# Patient Record
Sex: Female | Born: 2000
Health system: Southern US, Community
[De-identification: ages and names within clinical notes are randomized; demographics above are authoritative.]

## PROBLEM LIST (undated history)

## (undated) DIAGNOSIS — L700 Acne vulgaris: Secondary | ICD-10-CM

## (undated) DIAGNOSIS — R61 Generalized hyperhidrosis: Secondary | ICD-10-CM

## (undated) DIAGNOSIS — F429 Obsessive-compulsive disorder, unspecified: Secondary | ICD-10-CM

## (undated) DIAGNOSIS — F909 Attention-deficit hyperactivity disorder, unspecified type: Secondary | ICD-10-CM

## (undated) DIAGNOSIS — F411 Generalized anxiety disorder: Secondary | ICD-10-CM

## (undated) DIAGNOSIS — R5383 Other fatigue: Secondary | ICD-10-CM

## (undated) HISTORY — DX: Other fatigue: R53.83

## (undated) HISTORY — DX: Attention-deficit hyperactivity disorder, unspecified type: F90.9

## (undated) HISTORY — DX: Acne vulgaris: L70.0

## (undated) HISTORY — DX: Obsessive-compulsive disorder, unspecified: F42.9

## (undated) HISTORY — DX: Generalized hyperhidrosis: R61

## (undated) HISTORY — DX: Generalized anxiety disorder: F41.1

---
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
## 2019-04-11 DIAGNOSIS — Z20828 Contact with and (suspected) exposure to other viral communicable diseases: Secondary | ICD-10-CM | POA: Diagnosis not present

## 2019-09-23 ENCOUNTER — Ambulatory Visit: Payer: BC Managed Care – PPO | Admitting: Psychiatry

## 2019-09-24 ENCOUNTER — Encounter: Payer: Self-pay | Admitting: Psychiatry

## 2019-09-24 ENCOUNTER — Other Ambulatory Visit: Payer: Self-pay

## 2019-09-24 ENCOUNTER — Ambulatory Visit (INDEPENDENT_AMBULATORY_CARE_PROVIDER_SITE_OTHER): Payer: BC Managed Care – PPO | Admitting: Psychiatry

## 2019-09-24 VITALS — Ht 65.5 in | Wt 131.0 lb

## 2019-09-24 DIAGNOSIS — F902 Attention-deficit hyperactivity disorder, combined type: Secondary | ICD-10-CM

## 2019-09-24 DIAGNOSIS — F428 Other obsessive-compulsive disorder: Secondary | ICD-10-CM

## 2019-09-24 DIAGNOSIS — F411 Generalized anxiety disorder: Secondary | ICD-10-CM | POA: Diagnosis not present

## 2019-09-24 MED ORDER — SERTRALINE HCL 50 MG PO TABS: 50.0000 mg | ORAL_TABLET | Freq: Every day | ORAL | 1 refills | Status: DC
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
## 2019-10-01 ENCOUNTER — Other Ambulatory Visit: Payer: Self-pay

## 2019-10-01 ENCOUNTER — Ambulatory Visit (INDEPENDENT_AMBULATORY_CARE_PROVIDER_SITE_OTHER): Payer: BC Managed Care – PPO | Admitting: Addiction (Substance Use Disorder)

## 2019-10-01 DIAGNOSIS — F1220 Cannabis dependence, uncomplicated: Secondary | ICD-10-CM | POA: Diagnosis not present

## 2019-10-01 DIAGNOSIS — F4322 Adjustment disorder with anxiety: Secondary | ICD-10-CM

## 2019-10-01 DIAGNOSIS — F411 Generalized anxiety disorder: Secondary | ICD-10-CM

## 2019-10-01 NOTE — Progress Notes (Signed)
[facility] Counselor Initial Adult Exam  Name: Kathryn Blanchard Date: 10/01/2019 MRN: [id number] DOB: [birth year] PCP: Patient, No Pcp Per  Time spent: 2:10- 3:00 50 mins  Reason for Visit /Presenting Problem: Client came in reporting anxious distress related to family issues and parent split. Client also reporting issues that are somatic: ie: trouble eating, racing heart, panic attacks, blacking out. Client reported issues eating and now needing THC to eat, sleep, calm down from generalized anxiety thoughts and yet smoking THC (a joint- 1 gram a day) also triggering the panic attacks. Client reported wanting help on reducing her anxiety, increasing her motivation, and over-planning everything and obsessive thoughts. Client explained the things plaguing her: No desire to eat and losing 30 lbs and now being underweight. Client reporting emotional instability and struggles with emotional instability and disorganization. Client with ongoing fear of missing scheduled appts and also of not feeling clean (hygeine). Client experiencing racing thoughts, panic attacks (racing heart and darkening eyesight during attacks and hearing goes away and sweating), and trouble focusing and understanding what she is reading/ doing in math problems/ect. Client expressed goals with therapist and confirmed her tp. Client built therapeutic rapport with therapist and wants to continue coming to therapy via teletherapy if her insurance allows when she returns to [place] to go back to school.  Mental Status Exam:   Appearance:   Neat     Behavior:  Appropriate  Motor:  Normal  Speech/Language:   Normal Rate  Affect:  Full Range  Mood:  euphoric  Thought process:  normal  Thought content:    Rumination  Sensory/Perceptual disturbances:    WNL  Orientation:  x4  Attention:  Fair  Concentration:  Good  Memory:  WNL  Fund of knowledge:   Good  Insight:    Good  Judgment:   Fair  Impulse Control:  Fair   Reported Symptoms:   No desire to eat, emotional instability, no motivation to get out of bed and to engage socially at times, racing thoughts, panic attacks- racing heart and darkening eyesight during attacks and hearing goes away and sweating.    Risk Assessment: Danger to Self:  No Self-injurious Behavior: No Danger to Others: No Duty to Warn:no Physical Aggression / Violence:No  Access to Firearms a concern: No  Gang Involvement:No  Patient / guardian was educated about steps to take if suicide or homicide risk level increases between visits: n/a While future psychiatric events cannot be accurately predicted, the patient does not currently require acute inpatient psychiatric care and does not currently meet [facility] involuntary commitment criteria.  Substance Abuse History: Current substance abuse: Yes     Past Psychiatric History:   No previous psychological problems have been observed Outpatient Providers: none. History of Psych Hospitalization: No  Psychological Testing: n/a   Abuse History: Victim of No., n/a   Report needed: No. Victim of Neglect:No. Perpetrator of n/a  Witness / Exposure to Domestic Violence: No   Protective Services Involvement: No  Witness to [employer] Violence:  No   Family History:  Family History  Problem Relation Age of Onset  . Anxiety disorder Mother   . ADD / ADHD Mother   . Depression Mother   . OCD Father   . Alcohol abuse Neg Hx   . Bipolar disorder Neg Hx   . Dementia Neg Hx   . Drug abuse Neg Hx   . Paranoid behavior Neg Hx   . Physical abuse Neg Hx   . Schizophrenia  Neg Hx   . Seizures Neg Hx   . Sexual abuse Neg Hx     Living situation: the patient lives with their family. Parents with MH disorders.   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 History of legal issue / charges: n/a  Medical History/Surgical History: Past Medical History:  Diagnosis Date  . Acne vulgaris   . ADHD (attention deficit hyperactivity disorder)   . Diaphoresis   . Fatigue   . Obsessive-compulsive disorder     No past surgical history on file.  Medications: Current Outpatient Medications  Medication Sig Dispense Refill  . sertraline (ZOLOFT) 50 MG tablet Take 1 tablet (50 mg total) by mouth daily after breakfast. 301 tablet 1   No current facility-administered medications for this visit.   Diagnoses:    ICD-10-CM   1. Generalized anxiety disorder  F41.1   2. Adjustment disorder with anxious mood  F43.22   3. Moderate tetrahydrocannabinol (THC) dependence ([place])  F12.20     Plan of Care:  Client to return for weekly therapy with Sammuel Cooper, therapist, to review again in 6 months.  Client to engage in positive self talk and challenging negative internal ruminations and self talk causing client to be overly anxious and worried using CBT, on daily practice. Client to engage in mindfulness: ie body scans each eveneing to help process and discharge emotional distress caused by obsessive thoughts and compulsive behaviors and somatic anxieties.  Client to utilize BSP (brainspotting) with therapist to help client regulate their anxiety in a somatic- felt body sense way: (ie by working  to reduce muscle tension, ruminations, increased heart rate, constant worrying and feeling "dysregulated" ) by decreasing anxiety by 33% in the next 6 months.  Client to work on emotion regulation AEB having less angry outbursts at family when irritated/ stressed/anxious.  Client to assess reducing THC use by 33% AEB implementing harm reduction techniques to lessen her panic attacks triggered when high by 50%.   Client to prioritize sleep 8+ hours each week night AEB going to bed by 10pm each night.   Barnie Del, LCSW, LCAS, CCTP, CCS-I, BSP

## 2019-10-03 ENCOUNTER — Encounter: Payer: Self-pay | Admitting: Addiction (Substance Use Disorder)

## 2019-10-14 ENCOUNTER — Ambulatory Visit (INDEPENDENT_AMBULATORY_CARE_PROVIDER_SITE_OTHER): Payer: BC Managed Care – PPO | Admitting: Addiction (Substance Use Disorder)

## 2019-10-14 ENCOUNTER — Other Ambulatory Visit: Payer: Self-pay

## 2019-10-14 DIAGNOSIS — F4322 Adjustment disorder with anxiety: Secondary | ICD-10-CM | POA: Diagnosis not present

## 2019-10-14 DIAGNOSIS — F411 Generalized anxiety disorder: Secondary | ICD-10-CM

## 2019-10-14 DIAGNOSIS — F1220 Cannabis dependence, uncomplicated: Secondary | ICD-10-CM | POA: Diagnosis not present

## 2019-10-14 NOTE — Progress Notes (Signed)
      [facility] Counselor/Therapist Progress Note  Patient ID: Kathryn Blanchard, MRN: [id number],    Date: 10/14/2019  Time Spent: 1:15-2:00 45 mins  Treatment Type: Individual Therapy  Reported Symptoms: sweaty palms, racing heartrate, nerves, stomach uneasiness  Mental Status Exam:  Appearance:   Neat     Behavior:  Appropriate  Motor:  Tremor  Speech/Language:   Clear and Coherent  Affect:  Appropriate and Congruent  Mood:  anxious and constricted  Thought process:  normal  Thought content:    Obsessions and Rumination  Sensory/Perceptual disturbances:    WNL  Orientation:  x4  Attention:  Good  Concentration:  Good  Memory:  WNL  Fund of knowledge:   Good  Insight:    Good  Judgment:   Good  Impulse Control:  Good   Risk Assessment: Danger to Self:  No Self-injurious Behavior: No Danger to Others: No Duty to Warn:no Physical Aggression / Violence:No  Access to Firearms a concern: No  Gang Involvement:No   Subjective: Client came in late to session following a issue regarding online classes. Client reported having sweaty palms, racing heartrate, nerves, stomach uneasiness. Therapist provided psychoeducation around trauma, stress, the nervous system, and low body temperature related to stress/tense and higher related to more calm/relaxed. Client had body temperature of 71 degrees and therapist engaged in MI and Mindfulness with the client to lead client in learning to regulate her anxiety and understand more about her thoughts and triggers. Client engaged in [place] with the therapist to process overstimulation and irritability with her family/being touched/asked questions. Client practiced BSP and was able to make progress and identify more of the sensations while gaining compassion for herself and practicing curiosity. Therapist demonstrated CBT with client and taught her to identify the core thoughts triggering her anxiety. Client's body temperature raised up to 82  degrees following the session and a reduction of anxious thoughts and body sensations.    Interventions: Cognitive Behavioral Therapy, Mindfulness Meditation, Motivational Interviewing and Brainspotting  Diagnosis:   ICD-10-CM   1. Generalized anxiety disorder  F41.1   2. Adjustment disorder with anxious mood  F43.22   3. Tetrahydrocannabinol (THC) use disorder, moderate, dependence ([facility])  F12.20     Plan of Care: Client to return for weekly therapy with Zoila Shutter, therapist, to review again in 6 months. Client to engage in positive self talk and challenging negative internal ruminationsand self talk causing client to be overly anxious and worriedusing CBT, on daily practice. Client to engage in mindfulness: ie body scans each eveneing to help process and discharge emotional distress caused by obsessive thoughts and compulsive behaviors and somatic anxieties.  Client to utilize BSP (brainspotting) with therapist to help client regulate their anxiety in a somatic- felt body sense way: (ieby working to reducemuscle tension,ruminations,increased heart rate, constant worrying and feeling"dysregulated" ) by decreasing anxietyby 33% in the next 6 months. Client to work on emotion regulation AEB having less angry outbursts at family when irritated/ stressed/anxious.  Client to assess reducing THC use by 33% AEB implementing harm reduction techniques to lessen her panic attacks triggered when high by 50%.  Client to prioritize sleep 8+ hours each week night AEB going to bed by 10pm each night.  Pauline Good, LCSW, LCAS, CCTP, CCS-I, BSP

## 2019-10-15 DIAGNOSIS — Z20822 Contact with and (suspected) exposure to covid-19: Secondary | ICD-10-CM | POA: Diagnosis not present

## 2019-10-22 ENCOUNTER — Encounter: Payer: Self-pay | Admitting: Addiction (Substance Use Disorder)

## 2019-10-22 ENCOUNTER — Ambulatory Visit (INDEPENDENT_AMBULATORY_CARE_PROVIDER_SITE_OTHER): Payer: BC Managed Care – PPO | Admitting: Addiction (Substance Use Disorder)

## 2019-10-22 ENCOUNTER — Ambulatory Visit: Payer: BC Managed Care – PPO | Admitting: Psychiatry

## 2019-10-22 DIAGNOSIS — F4322 Adjustment disorder with anxiety: Secondary | ICD-10-CM | POA: Diagnosis not present

## 2019-10-22 DIAGNOSIS — F1220 Cannabis dependence, uncomplicated: Secondary | ICD-10-CM | POA: Diagnosis not present

## 2019-10-22 DIAGNOSIS — F411 Generalized anxiety disorder: Secondary | ICD-10-CM | POA: Diagnosis not present

## 2019-10-22 NOTE — Progress Notes (Signed)
[facility] Counselor/Therapist Progress Note  Patient ID: Kathryn Blanchard, MRN: [id number],    Date: 10/22/2019  Time Spent: 2:05-2:59 54 mins  Treatment Type: Individual Therapy  Reported Symptoms: excessive sweating, panic attacks, feeling unable to keep up at school due to no privacy to do online classes/ MH counseling sessions.   Mental Status Exam:  Appearance:   Neat     Behavior:  Appropriate  Motor:  Normal  Speech/Language:   Clear and Coherent  Affect:  Appropriate and Congruent  Mood:  anxious and dysthymic  Thought process:  normal  Thought content:    Obsessions and Rumination  Sensory/Perceptual disturbances:    WNL  Orientation:  x4  Attention:  Good  Concentration:  Good  Memory:  WNL  Fund of knowledge:   Good  Insight:    Good  Judgment:   Good  Impulse Control:  Good   Risk Assessment: Danger to Self:  No Self-injurious Behavior: No Danger to Others: No Duty to Warn:no Physical Aggression / Violence:No  Access to Firearms a concern: No  Gang Involvement:No   Virtual Visit via Telephone Note I Connected with client by a video enabled telemedicine/telehealth application or telephone, with their informed consent, and verified client privacy and that I am speaking with the correct person using two identifiers. I discussed the limitations, risks, security and privacy concerns of performing psychotherapy and management service by telephone/teletherapy and the availability of in person appointments and confirmed their location. I also discussed with the patient that there may be a patient responsible charge related to this service and to confirm with the front desk if their insurance accepts teletherapy. The patient expressed understanding and agreed to proceed. I discussed the treatment planning with the client. The client was provided an opportunity to ask questions and all were answered. The client agreed with the plan and demonstrated an understanding of  the instructions. The client was advised to call our office if symptoms worsen or feel they are in a crisis state and need immediate contact. Client also reminded of a crisis line number and to use 9-1-1 if there's an emergency.  Therapist Location: office; Client Location: her car outside her dorm up at [employer].  Subjective: Client discussed her top stressors in life right now since going back up to [employer]/school. Client processed some of the issues with sharing a room in a dorm since returning to school and not having privacy. Client talked about the issues with not even being able to find a quiet room/place in the dorm or room to do her MH counseling sessions or virtual school classes. Client reported that she is even in her car right now with it being 25 degrees outside also and discussed her decision making process with trying to ask to move off campus so she can have her own room to do the things she needs to to take care of her anxiety. Therapist used MI, CBT, and SFt to discuss with client her concerns, thoughts, desires and to have a safe place to talk openly. Therapist also inquired about client's goals for her living situation and MH and client expressed a need to: do what makes her healthy and her intentions to keep assessing for the next couple weeks. Client reported having more panic attacks and excessive sweating and isolating more since returning to school, but denied SI/HI/AVH. Client shared about the reason she wants to stay in the dorm because she is so close to her roommate  who really supports her MH. Client discussed other things stressing her MH including the toxic relationship with a boy who she feels comforted by but doesn't really make her happy. Client said she was unsure of what to do, but as they used CBT & MI reflections client realized she knew what to do, but needed to grieve the comforts (having his apt to go to to study/hang) before being able to let him go.    Interventions:  Cognitive Behavioral Therapy, Mindfulness Meditation, Motivational Interviewing and Solution-Oriented/Positive Psychology  Diagnosis:   ICD-10-CM   1. Generalized anxiety disorder  F41.1   2. Adjustment disorder with anxious mood  F43.22   3. Tetrahydrocannabinol (THC) use disorder, moderate, dependence ([place])  F12.20     Plan of Care: Client to return for weekly therapy with Sammuel Cooper, therapist, to review again in 6 months. Client to engage in positive self talk and challenging negative internal ruminationsand self talk causing client to be overly anxious and worriedusing CBT, on daily practice. Client to engage in mindfulness: ie body scans each eveneing to help process and discharge emotional distress caused by obsessive thoughts and compulsive behaviors and somatic anxieties.  Client to utilize BSP (brainspotting) with therapist to help client regulate their anxiety in a somatic- felt body sense way: (ieby working to reducemuscle tension,ruminations,increased heart rate, constant worrying and feeling"dysregulated" ) by decreasing anxietyby 33% in the next 6 months. Client to work on emotion regulation AEB having less angry outbursts at family when irritated/ stressed/anxious.  Client to assess reducing THC use by 33% AEB implementing harm reduction techniques to lessen her panic attacks triggered when high by 50%.  Client to prioritize sleep 8+ hours each week night AEB going to bed by 10pm each night.  Barnie Del, LCSW, LCAS, CCTP, CCS-I, BSP

## 2019-10-28 ENCOUNTER — Ambulatory Visit (INDEPENDENT_AMBULATORY_CARE_PROVIDER_SITE_OTHER): Payer: BC Managed Care – PPO | Admitting: Psychiatry

## 2019-10-28 ENCOUNTER — Encounter: Payer: Self-pay | Admitting: Psychiatry

## 2019-10-28 DIAGNOSIS — F428 Other obsessive-compulsive disorder: Secondary | ICD-10-CM

## 2019-10-28 DIAGNOSIS — F902 Attention-deficit hyperactivity disorder, combined type: Secondary | ICD-10-CM | POA: Diagnosis not present

## 2019-10-28 DIAGNOSIS — F411 Generalized anxiety disorder: Secondary | ICD-10-CM | POA: Diagnosis not present

## 2019-10-28 MED ORDER — VENLAFAXINE HCL ER 37.5 MG PO CP24: 37.5000 mg | ORAL_CAPSULE | Freq: Every day | ORAL | 1 refills | Status: DC

## 2019-10-28 NOTE — Progress Notes (Signed)
[facility] Med Check  Patient ID: Kathryn Blanchard,  MRN: [id number]  PCP: Patient, No Pcp Per  Date of Evaluation: 10/28/2019 Time spent:20 minutes from 1000 to 4  Chief Complaint:  Chief Complaint    Anxiety; ADHD      HISTORY/CURRENT STATUS: Kathryn Blanchard is provided telemedicine audiovisual appointment session 20 minutes video to video with consent with epic collateral for psychiatric interview and exam in 4-week evaluation and management of generalized anxiety and obsessive-compulsive disorder/ADHD.  She did start Zoloft successfully at 50 mg every morning finding side effect of excessive sweating even in the cold weather at [place].  She has found modest but definite efficacy though anxiety is still prominent but slightly improved.  Her compulsive shopping, cleaning rituals involving showers and skin grooming, and obsessive slowness particularly procrastinating getting out of bed have not improved.  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  She is back on campus and did have 3 telemedicine appointments with Kathryn Cooper, LCSW January 12 and 25 and October 22, 2019 and is scheduled for another.  Refused any further treatment after 1 therapy session a year ago elsewhere.  She reports no other observations from family or friends.  She reports no change in her pattern of use of cannabis, vaping, alcohol, having no significant consequences currently.  She has no mania, suicidality, psychosis or delirium.  Anxiety Presents for initial visit. Onset was more than 5 years ago. The problem has been gradually worsening. Symptoms include compulsions, decreased concentration, dizziness, excessive worry, muscle tension, nervous/anxious behavior, obsessions and panic. Patient reports no confusion, depressed mood or suicidal ideas. Symptoms occur most days. The severity of symptoms is interfering with daily activities, causing significant distress and moderate. The symptoms are aggravated  by social activities, work stress and medication. The quality of sleep is fair. Nighttime awakenings: one to two.   Risk factors include recent illness, a major life event, illicit drug use and alcohol intake. Her past medical history is significant for anxiety/panic attacks. There is no history of bipolar disorder, depression, hyperthyroidism or suicide attempts. Past treatments include counseling (CBT) and SSRIs. The treatment provided mild relief. Compliance with prior treatments has been variable. Prior compliance problems include difficulty with treatment plan and medication issues.    Individual Medical History/ Review of Systems: Changes? :No Needed Covid test in the interim.  Allergies: Patient has no known allergies.  Current Medications:  Current Outpatient Medications:  .  venlafaxine XR (EFFEXOR-XR) 37.5 MG 24 hr capsule, Take 1 capsule (37.5 mg total) by mouth daily with breakfast., Disp: 30 capsule, Rfl: 1   Medication Side Effects: Hyperhidrosis.  Family Medical/ Social History: Changes? No  MENTAL HEALTH EXAM:  There were no vitals taken for this visit.There is no height or weight on file to calculate BMI.  Not present here today.  General Appearance: N/A  Eye Contact:  N/A  Speech:  Clear and Coherent, Normal Rate and Talkative  Volume:  Normal  Mood:  Anxious, Euthymic, Irritable and Worthless  Affect:  Congruent, Inappropriate, Full Range and Anxious  Thought Process:  Coherent, Goal Directed, Irrelevant and Linear and Circumstantial associations  Orientation:  Full (Time, Place, and Person)  Thought Content: Logical, Ilusions, Obsessions and Rumination   Suicidal Thoughts:  No  Homicidal Thoughts:  No  Memory:  Immediate;   Good Remote;   Good  Judgement:  Fair  Insight:  Fair  Psychomotor Activity:  N/A  Concentration:  Concentration: Fair and  Attention Span: Poor  Recall:  Fair  Fund of Knowledge: Good  Language: Good  Assets:  Desire for  Improvement Resilience Social Support Talents/Skills  ADL's:  Intact  Cognition: WNL  Prognosis:  Good    DIAGNOSES:    ICD-10-CM   1. Generalized anxiety disorder  F41.1 venlafaxine XR (EFFEXOR-XR) 37.5 MG 24 hr capsule  2. Attention deficit hyperactivity disorder (ADHD), combined type, moderate  F90.2 venlafaxine XR (EFFEXOR-XR) 37.5 MG 24 hr capsule  3. Other obsessive-compulsive disorder  F42.8 venlafaxine XR (EFFEXOR-XR) 37.5 MG 24 hr capsule    Receiving Psychotherapy: Yes with Kathryn Shutter, LCSW   RECOMMENDATIONS: Psychosupportive psychoeducation updates prevention and monitoring and safety hygiene for options such as increasing Zoloft versus changing to an alternative or combined medications.  We had discussed the possibility of adding a stimulant to Zoloft but at this time with her side effects, symptom treatment matching concludes to discontinue Zoloft and change to Effexor and she prefers to start at the lowest dose.  She is E scribed Effexor 37.5 mg XR every morning sent as #30 with 1 refill to [facility] on [address].  We discussed the possibly of doubling the dose in 1 week if well-tolerated but not having sufficient projection of efficacy.  We review all issues for integrating with cognitive behavioral and other therapy formats without any change evident yet in vaping and social substance use.  She will follow up in 2 to 3 weeks for next steps and medication management or sooner if needed.   Virtual Visit via Video Note  I connected with Kathryn Blanchard on 10/28/19 at 10:00 AM EST by a video enabled telemedicine application and verified that I am speaking with the correct person using two identifiers.  Location: Patient: individually audio only declining video camera for panic and generalized anxiety with privacy at college residence. Provider: [facility] office   I discussed the limitations of evaluation and management by telemedicine and the availability of  in person appointments. The patient expressed understanding and agreed to proceed.  History of Present Illness: 4-week evaluation and management address generalized anxiety and obsessive-compulsive disorder/ADHD.  She did start Zoloft successfully at 50 mg every morning finding side effect of excessive sweating even in the cold weather at [place].  She has found modest but definite efficacy though anxiety is still prominent but slightly improved.  Her compulsive shopping, cleaning rituals involving showers and skin grooming, and obsessive slowness particularly procrastinating getting out of bed have not improved.    Observations/Objective: Mood:  Anxious, Euthymic, Irritable and Worthless  Affect:  Congruent, Inappropriate, Full Range and Anxious  Thought Process:  Coherent, Goal Directed, Irrelevant and Linear and Circumstantial associations  Orientation:  Full (Time, Place, and Person)  Thought Content: Logical, Ilusions, Obsessions and Rumination    Assessment and Plan: Psychosupportive psychoeducation updates prevention and monitoring and safety hygiene for options such as increasing Zoloft versus changing to an alternative or combined medications.  We had discussed the possibility of adding a stimulant to Zoloft but at this time with her side effects, symptom treatment matching concludes to discontinue Zoloft and change to Effexor and she prefers to start at the lowest dose.  She is E scribed Effexor 37.5 mg XR every morning sent as #30 with 1 refill to [facility] on [address].  We discussed the possibly of doubling the dose in 1 week if well-tolerated but not having sufficient projection of efficacy.  We review all issues for integrating with  cognitive behavioral and other therapy formats without any change evident yet in vaping and social substance use.  Follow Up Instructions:  She will follow up in 2 to 3 weeks for next steps and medication management or sooner if needed.    I discussed  the assessment and treatment plan with the patient. The patient was provided an opportunity to ask questions and all were answered. The patient agreed with the plan and demonstrated an understanding of the instructions.   The patient was advised to call back or seek an in-person evaluation if the symptoms worsen or if the condition fails to improve as anticipated.  I provided 20 minutes of non-face-to-face time during this encounter. [place] Web[employer] meeting #[id number] Meeting password: [email or web address]  Chauncey Mann, MD   Chauncey Mann, MD

## 2019-11-01 ENCOUNTER — Encounter: Payer: Self-pay | Admitting: Addiction (Substance Use Disorder)

## 2019-11-01 ENCOUNTER — Ambulatory Visit: Payer: BC Managed Care – PPO | Admitting: Addiction (Substance Use Disorder)

## 2019-11-01 ENCOUNTER — Ambulatory Visit (INDEPENDENT_AMBULATORY_CARE_PROVIDER_SITE_OTHER): Payer: BC Managed Care – PPO | Admitting: Addiction (Substance Use Disorder)

## 2019-11-01 DIAGNOSIS — F4322 Adjustment disorder with anxiety: Secondary | ICD-10-CM

## 2019-11-01 DIAGNOSIS — F4010 Social phobia, unspecified: Secondary | ICD-10-CM

## 2019-11-01 DIAGNOSIS — F411 Generalized anxiety disorder: Secondary | ICD-10-CM

## 2019-11-01 NOTE — Progress Notes (Signed)
[facility] Counselor/Therapist Progress Note  Patient ID: LINDSAY STRAKA, MRN: [id number],    Date: 11/01/2019  Time Spent: 12:02-1:05 63 mins  Treatment Type: Individual Therapy  Reported Symptoms: less depression/ more motivation/ excessive anxiety and sweating and racing heart/ panic attacks.  Mental Status Exam:  Appearance:   Well Groomed     Behavior:  Appropriate  Motor:  Normal  Speech/Language:   Clear and Coherent  Affect:  Appropriate and Congruent  Mood:  anxious  Thought process:  normal  Thought content:    Obsessions and Rumination  Sensory/Perceptual disturbances:    WNL  Orientation:  x4  Attention:  Good  Concentration:  Good  Memory:  WNL  Fund of knowledge:   Good  Insight:    Good  Judgment:   Good  Impulse Control:  Good   Risk Assessment: Danger to Self:  No Self-injurious Behavior: No Danger to Others: No Duty to Warn:no Physical Aggression / Violence:No  Access to Firearms a concern: No  Gang Involvement:No   Virtual Visit via Telephone Note I Connected with client by a video enabled telemedicine/telehealth application or telephone, with their informed consent, and verified client privacy and that I am speaking with the correct person using two identifiers. I discussed the limitations, risks, security and privacy concerns of performing psychotherapy and management service by telephone/teletherapy and the availability of in person appointments and confirmed their location. I also discussed with the patient that there may be a patient responsible charge related to this service and to confirm with the front desk if their insurance accepts teletherapy. The patient expressed understanding and agreed to proceed. I discussed the treatment planning with the client. The client was provided an opportunity to ask questions and all were answered. The client agreed with the plan and demonstrated an understanding of the instructions. The client was advised to  call our office if symptoms worsen or feel they are in a crisis state and need immediate contact. Client also reminded of a crisis line number and to use 9-1-1 if there's an emergency.  Therapist Location: home; Client Location: her dorm.  Subjective: Client discussed having made a medication change to help improve her anxiety symptoms she was still having. Client reported a rush of heat, panic (racing heart/sweating), and then anger when confined with others in small places or physically stuck in places. Client reported doing everything she can to avoid those places with friends or in public,causing her to avoid relationships and things shed like to do/to enjoy. Client reports attempting to control her outburst, but worsens the anxiety/panic/physically disturbing sensations: feeling suffocated and losing sight/hearing. Therapist used psychoeducation to help normalize client's experiences and BSP to process first times she began to feel these suffocating/dimmed sensory sensations. Client began to tie triggers together to other social situations that brought on panic attacks: ie concerts, mosh pits, crowded classes, and closed up small cars where she didn't have room. Client made progress identifying these sensations and identifying other moments of loss of control connected to social and situational anxiety. Client processed somatic things and thoughts connected to traumatic experiences. Client also identified thoughts involved such as: "why did I just talk about that/share that!?!" and client found her most resourced eye position to process her anxiety sensations in until her SUDs reduced to a 1/10 during session. Therapist used DBT- distress tolerance coping skills with client. Client made progress learning how implement these DBT skills in future to help build her resilence.  Interventions: Cognitive Behavioral Therapy, Mindfulness Meditation, Motivational Interviewing and Solution-Oriented/Positive  Psychology  Diagnosis:   ICD-10-CM   1. Generalized anxiety disorder  F41.1   2. Social anxiety disorder  F40.10   3. Adjustment disorder with anxious mood  F43.22     Plan of Care: Client to return for weekly therapy with Sammuel Cooper, therapist, to review again in 6 months. Client to engage in positive self talk and challenging negative internal ruminationsand self talk causing client to be overly anxious and worriedusing CBT, on daily practice. Client to engage in mindfulness: ie body scans each eveneing to help process and discharge emotional distress caused by obsessive thoughts and compulsive behaviors and somatic anxieties.  Client to utilize BSP (brainspotting) with therapist to help client regulate their anxiety in a somatic- felt body sense way: (ieby working to reducemuscle tension,ruminations,increased heart rate, constant worrying and feeling"dysregulated" ) by decreasing anxietyby 33% in the next 6 months. Client to work on emotion regulation AEB having less angry outbursts at family when irritated/ stressed/anxious.  Client to assess reducing THC use by 33% AEB implementing harm reduction techniques to lessen her panic attacks triggered when high by 50%.  Client to prioritize sleep 8+ hours each week night AEB going to bed by 10pm each night.  Barnie Del, LCSW, LCAS, CCTP, CCS-I, BSP

## 2019-11-11 ENCOUNTER — Ambulatory Visit: Payer: BC Managed Care – PPO | Admitting: Addiction (Substance Use Disorder)

## 2019-11-23 ENCOUNTER — Other Ambulatory Visit: Payer: Self-pay | Admitting: Psychiatry

## 2019-11-23 DIAGNOSIS — F428 Other obsessive-compulsive disorder: Secondary | ICD-10-CM

## 2019-11-23 DIAGNOSIS — F411 Generalized anxiety disorder: Secondary | ICD-10-CM

## 2019-11-23 DIAGNOSIS — F902 Attention-deficit hyperactivity disorder, combined type: Secondary | ICD-10-CM

## 2019-11-25 NOTE — Telephone Encounter (Signed)
Apt 02/08, due to follow up

## 2019-12-17 ENCOUNTER — Telehealth: Payer: Self-pay | Admitting: Psychiatry

## 2019-12-17 DIAGNOSIS — F902 Attention-deficit hyperactivity disorder, combined type: Secondary | ICD-10-CM

## 2019-12-17 DIAGNOSIS — F411 Generalized anxiety disorder: Secondary | ICD-10-CM

## 2019-12-17 DIAGNOSIS — Z20828 Contact with and (suspected) exposure to other viral communicable diseases: Secondary | ICD-10-CM | POA: Diagnosis not present

## 2019-12-17 DIAGNOSIS — F428 Other obsessive-compulsive disorder: Secondary | ICD-10-CM

## 2019-12-17 MED ORDER — VENLAFAXINE HCL ER 75 MG PO CP24: 75.0000 mg | ORAL_CAPSULE | Freq: Every day | ORAL | 0 refills | Status: DC

## 2019-12-17 NOTE — Telephone Encounter (Signed)
Mother calls the office about increasing Effexor XR for continued high anxiety instructing that an answer be called back to [phone].  As there is no answer, I could only leave message whether Tanazia or mother that she is now a month overdue for follow-up possibly having exhausted the first fill and refill except that pharmacy appears to have requested a 90-day supply March 8 which if patient picked that up she can double to two of the 375 mg XR every morning otherwise sending a month supply of the 75 g XR and no refill to [facility] advising follow-up appointment as planned.

## 2019-12-17 NOTE — Telephone Encounter (Signed)
Patient's mom called and said that the effexor xr does not seem to be working . She is still having high anxiety. She would like to increase the effexor or change to something different. Please give her a call at [phone] and let her know what to do.

## 2019-12-20 DIAGNOSIS — Z20828 Contact with and (suspected) exposure to other viral communicable diseases: Secondary | ICD-10-CM | POA: Diagnosis not present

## 2019-12-20 DIAGNOSIS — Z03818 Encounter for observation for suspected exposure to other biological agents ruled out: Secondary | ICD-10-CM | POA: Diagnosis not present

## 2020-01-08 ENCOUNTER — Other Ambulatory Visit: Payer: Self-pay | Admitting: Psychiatry

## 2020-01-08 DIAGNOSIS — F902 Attention-deficit hyperactivity disorder, combined type: Secondary | ICD-10-CM

## 2020-01-08 DIAGNOSIS — F411 Generalized anxiety disorder: Secondary | ICD-10-CM

## 2020-01-08 DIAGNOSIS — F428 Other obsessive-compulsive disorder: Secondary | ICD-10-CM

## 2020-01-08 NOTE — Telephone Encounter (Signed)
Phone messaging indicated a follow up should be scheduled?

## 2020-01-08 NOTE — Telephone Encounter (Signed)
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

## 2020-02-03 ENCOUNTER — Other Ambulatory Visit: Payer: Self-pay

## 2020-02-03 DIAGNOSIS — F411 Generalized anxiety disorder: Secondary | ICD-10-CM

## 2020-02-03 DIAGNOSIS — F902 Attention-deficit hyperactivity disorder, combined type: Secondary | ICD-10-CM

## 2020-02-03 DIAGNOSIS — F428 Other obsessive-compulsive disorder: Secondary | ICD-10-CM

## 2020-02-03 MED ORDER — VENLAFAXINE HCL ER 75 MG PO CP24: 75.0000 mg | ORAL_CAPSULE | Freq: Every day | ORAL | 0 refills | Status: DC

## 2020-02-18 ENCOUNTER — Telehealth: Payer: Self-pay | Admitting: Psychiatry

## 2020-02-18 NOTE — Telephone Encounter (Signed)
Pt called and is taking Effexor. She has noticed night sweats over the last few months. Seems to fluctuate.

## 2020-02-18 NOTE — Telephone Encounter (Signed)
First appointment in January patient was taking Zoloft then changed to Effexor February 8 as she was sweating too much on Zoloft even in the cold of winter at [facility] [employer] [facility] in [place].  Mother left a message in the March that [facility] had high anxiety on 37.5 mg Effexor so that we have provided the 75 mg since then, but answering the phone calls reaches only answering machine  including today and attempting to phone mother in March reached a curious group of persons talking without privacy possible.  The patient did not return in 3 weeks after last appointment February 12 now 3 months overdue so that the message encouraged her to schedule an appointment possibly consider Paxil in place of the Effexor if Effexor is the cause of current sweating, though she may to discontinue Effexor to be certain it is the only cause of sweating.

## 2020-02-19 ENCOUNTER — Telehealth: Payer: Self-pay | Admitting: Psychiatry

## 2020-02-19 NOTE — Telephone Encounter (Signed)
Pt asked that you call her back on her cell# [phone]. Pt said that her mother was called and she does not live with her.

## 2020-02-19 NOTE — Telephone Encounter (Signed)
Phone call returned to [facility] who  informs me to use the number [phone] as the other number she left at the office was her mother's number therefore possibly explaining why I always get the answering machine and not reach her when she requests a call.  She informs me that she does not tolerate therapy and she is working on that with mother.  She asks about the sweating again as I clarified as at last appointment the change from Zoloft for sweating to Effexor to hopefully have less or no sweating. She says on the 75 mg XR she gets good efficacy with her best grades ever in college with less anxiety, but she is having sweats in her bed that required washing sheets or sweats in her clothes.  I discussed options such as fine-tuning of the Effexor with a short acting tablet 25 mg twice a day to find the best time and amount of dosing to minimize sweating.  I discussed changed to Paxil.  I discuss seeing the dermatologist about sweating.  The patient closes the call stating she will think about it, and I have encouraged her to come to the office for an appointment even if she does not have therapy.  She states she may be willing to have A/V appointment again as she is in [place].  He declines the suggestion that she taper off the Effexor during the summer to assess sweating off the Effexor relative to any other differential.

## 2020-03-03 ENCOUNTER — Other Ambulatory Visit: Payer: Self-pay

## 2020-03-03 DIAGNOSIS — F902 Attention-deficit hyperactivity disorder, combined type: Secondary | ICD-10-CM

## 2020-03-03 DIAGNOSIS — F411 Generalized anxiety disorder: Secondary | ICD-10-CM

## 2020-03-03 DIAGNOSIS — F428 Other obsessive-compulsive disorder: Secondary | ICD-10-CM

## 2020-03-03 MED ORDER — VENLAFAXINE HCL ER 75 MG PO CP24: 75.0000 mg | ORAL_CAPSULE | Freq: Every day | ORAL | 0 refills | Status: DC

## 2020-04-08 ENCOUNTER — Other Ambulatory Visit: Payer: Self-pay | Admitting: Psychiatry

## 2020-04-08 DIAGNOSIS — F428 Other obsessive-compulsive disorder: Secondary | ICD-10-CM

## 2020-04-08 DIAGNOSIS — F411 Generalized anxiety disorder: Secondary | ICD-10-CM

## 2020-04-08 DIAGNOSIS — F902 Attention-deficit hyperactivity disorder, combined type: Secondary | ICD-10-CM

## 2020-04-08 NOTE — Telephone Encounter (Signed)
Patient phoned June 2 disapproving of this Effexor she associates with excessive sweating and she admits he got 30-day refill June 15 patient stating she is still at college for the summer and could only do a telemedicine appointment unless she finds a time to visit home.  Will send in another 30-day supply case she is still taking the Effexor 75 mg XR though we discussed 25 mg IR twice daily, going to [facility].

## 2020-04-08 NOTE — Telephone Encounter (Signed)
Last apt 02/08

## 2020-05-06 ENCOUNTER — Ambulatory Visit (HOSPITAL_COMMUNITY)
Admission: EM | Admit: 2020-05-06 | Discharge: 2020-05-06 | Disposition: A | Discharge status: Other Acute Inpatient Hospital | Payer: BC Managed Care – PPO

## 2020-05-06 ENCOUNTER — Encounter (HOSPITAL_COMMUNITY): Payer: Self-pay | Admitting: Emergency Medicine

## 2020-05-06 ENCOUNTER — Emergency Department (HOSPITAL_COMMUNITY)
Admission: EM | Admit: 2020-05-06 | Discharge: 2020-05-06 | Disposition: A | Discharge status: Home / Self Care | Payer: BC Managed Care – PPO | Attending: Emergency Medicine | Admitting: Emergency Medicine

## 2020-05-06 ENCOUNTER — Other Ambulatory Visit: Payer: Self-pay

## 2020-05-06 DIAGNOSIS — F419 Anxiety disorder, unspecified: Secondary | ICD-10-CM | POA: Diagnosis not present

## 2020-05-06 DIAGNOSIS — F909 Attention-deficit hyperactivity disorder, unspecified type: Secondary | ICD-10-CM | POA: Diagnosis not present

## 2020-05-06 DIAGNOSIS — Z046 Encounter for general psychiatric examination, requested by authority: Secondary | ICD-10-CM | POA: Diagnosis not present

## 2020-05-06 MED ORDER — ALPRAZOLAM 0.25 MG PO TABS: 0.2500 mg | ORAL_TABLET | Freq: Two times a day (BID) | ORAL | 0 refills | Status: DC | PRN

## 2020-05-06 MED ORDER — HYDROXYZINE HCL 25 MG PO TABS: 25.0000 mg | ORAL_TABLET | Freq: Four times a day (QID) | ORAL | 0 refills | Status: DC

## 2020-05-06 NOTE — ED Triage Notes (Signed)
Patient states she has had the most stressful two weeks of her life, wanting an emergency prescription for antidepressants before she goes back to college. States she lost her support pet recently and that was an added stress. She is a sophomore at [employer]. She traveled to [place] recently and she does not travel well, this was very stressful.

## 2020-05-06 NOTE — Discharge Instructions (Signed)
Try taking the hydroxyzine for your anxiety as needed.  If this does not work you may start with the Xanax.   Follow-up with psychiatry.  I would recommend calling to see if he can have a sooner appointment.  Return to the emergency department if you have any worsening symptoms.

## 2020-05-06 NOTE — Care Management (Signed)
Patient wants to receive services with a psychiatrist and a therapist on an outpatient basis.  Patient was linked with our outpatient clinic in order to schedule an appt.    Patient denies SI/HI/Psychosis/Substance Abuse.  Patient declined MSE Exam.    Patient reports increase anxiety because her emotional support cat ran away.

## 2020-05-06 NOTE — ED Provider Notes (Signed)
[facility]-EMERGENCY DEPT Provider Note   CSN: [id number] Arrival date & time: 05/06/20  1524    History Chief Complaint  Patient presents with  . Psychiatric Evaluation    Kathryn Blanchard is a 19 y.o. female with past medical history significant for ADHD, Anxiety, OCD who presents for evaluation of anxiety.  Patient states she has been dealing with anxiety times years.  Was previously seen at [facility] psychiat[facility] however has tried to establish care with [facility].  She has filled out her intake paperwork, they told her they could probably get her in at the end of this month, early September.  She has no SI, HI, AVH.  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  Is having panic attacks. Recently had her emotional support cat run away from home.   Effexor 75 mg for anxiety  Took moms xanax for anxiety which helped some.  No headache, lightheadedness, dizziness, syncope, chest pain, shortness of breath, palpitations, hemoptysis, lateral leg swelling, redness, warmth.  Went to [facility] however was told they only do inpatient treatment and to follow up outpatient with her personal psychiatrist.  Good support system- Mom  Does not want inpatient management. Requesting additional medication for anxiety.  History taken from patient, past medical records, family room.  Denies aggravating or relieving factors.  Patient gives permission to discuss history, exam and plan with Mother in room   HPI    Past Medical History:  Diagnosis Date  . Acne vulgaris   . ADHD (attention deficit hyperactivity disorder)   . Diaphoresis   . Fatigue   . Obsessive-compulsive disorder     Patient Active Problem List   Diagnosis Date Noted  . Generalized anxiety disorder 09/24/2019  . Attention deficit hyperactivity disorder (ADHD), combined type, moderate 09/24/2019  . Other obsessive-compulsive disorder 09/24/2019    History  reviewed. No pertinent surgical history.   OB History   No obstetric history on file.     Family History  Problem Relation Age of Onset  . Anxiety disorder Mother   . ADD / ADHD Mother   . Depression Mother   . OCD Father   . Alcohol abuse Neg Hx   . Bipolar disorder Neg Hx   . Dementia Neg Hx   . Drug abuse Neg Hx   . Paranoid behavior Neg Hx   . Physical abuse Neg Hx   . Schizophrenia Neg Hx   . Seizures Neg Hx   . Sexual abuse Neg Hx     Social History   Tobacco Use  . Smoking status: Never Smoker  . Smokeless tobacco: Never Used  Vaping Use  . Vaping Use: Every day  Substance Use Topics  . Alcohol use: Yes    Alcohol/week: 3.0 standard drinks    Types: 3 Standard drinks or equivalent per week  . Drug use: Yes    Types: Marijuana    Home Medications Prior to Admission medications   Medication Sig Start Date End Date Taking? Authorizing Provider  ALPRAZolam (XANAX) 0.25 MG tablet Take 1 tablet (0.25 mg total) by mouth 2 (two) times daily as needed for anxiety. 05/06/20   Karinda Cabriales A, PA-C  hydrOXYzine (ATARAX/VISTARIL) 25 MG tablet Take 1 tablet (25 mg total) by mouth every 6 (six) hours. 05/06/20   Cace Osorto A, PA-C  venlafaxine XR (EFFEXOR-XR) 75 MG 24 hr capsule TAKE 1 CAPSULE (75 MG TOTAL) BY MOUTH DAILY WITH BREAKFAST. 04/08/20  Chauncey Mann, MD    Allergies    Patient has no known allergies.  Review of Systems   Review of Systems  Constitutional: Negative.   HENT: Negative.   Eyes: Negative.   Respiratory: Negative.   Cardiovascular: Negative.   Gastrointestinal: Negative.   Genitourinary: Negative.   Skin: Negative.   Psychiatric/Behavioral: Positive for sleep disturbance. Negative for self-injury and suicidal ideas. The patient is nervous/anxious.   All other systems reviewed and are negative.   Physical Exam Updated Vital Signs BP 112/79 (BP Location: Left Arm)   Pulse 83   Temp 98.5 F (36.9 C) (Oral)   Resp 18   Ht  5\' 5"  (1.651 m)   Wt 60.3 kg   SpO2 100%   BMI 22.13 kg/m   Physical Exam Vitals and nursing note reviewed.  Constitutional:      General: She is not in acute distress.    Appearance: She is well-developed. She is not ill-appearing, toxic-appearing or diaphoretic.  HENT:     Head: Normocephalic and atraumatic.     Nose: Nose normal.     Mouth/Throat:     Mouth: Mucous membranes are moist.  Eyes:     Pupils: Pupils are equal, round, and reactive to light.  Cardiovascular:     Rate and Rhythm: Normal rate.     Pulses: Normal pulses.     Heart sounds: Normal heart sounds.  Pulmonary:     Effort: Pulmonary effort is normal. No respiratory distress.     Breath sounds: Normal breath sounds.  Abdominal:     General: Bowel sounds are normal. There is no distension.  Musculoskeletal:        General: No swelling, tenderness, deformity or signs of injury. Normal range of motion.     Cervical back: Normal range of motion.     Right lower leg: No edema.     Left lower leg: No edema.     Comments: Homan's negative. Compartments soft  Skin:    General: Skin is warm and dry.     Capillary Refill: Capillary refill takes less than 2 seconds.  Neurological:     General: No focal deficit present.     Mental Status: She is alert and oriented to person, place, and time.  Psychiatric:        Attention and Perception: Attention and perception normal.        Mood and Affect: Mood is anxious.        Speech: Speech normal.        Behavior: Behavior is cooperative.        Thought Content: Thought content normal.        Cognition and Memory: Cognition normal.     Comments: Mildly anxious however good insight. Denies SI, HI, AVH.    ED Results / Procedures / Treatments   Labs (all labs ordered are listed, but only abnormal results are displayed) Labs Reviewed - No data to display  EKG None  Radiology No results found.  Procedures Procedures (including critical care time)  Medications  Ordered in ED Medications - No data to display  ED Course  I have reviewed the triage vital signs and the nursing notes.  Pertinent labs & imaging results that were available during my care of the patient were reviewed by me and considered in my medical decision making (see chart for details).  19 year old presents for evaluation of anxiety.  She is currently on Effexor.  She is switching from [facility] to [facility].  Has appointment at the end of this month.  Has had some increased anxiety as well as panic attacks with thoughts of resuming college again.  She recently had her emotional support pet right away.  She has no SI, HI, AVH.  She appears overall well.  No chest pain, shortness of breath.  She has no palpitations.  No tachycardia, tachypnea or hypoxia.  Low suspicion for PE as atypical symptoms of anxiety.  Apparently seen by behavioral health earlier today they told her they did not have any appointments and that she need to follow-up with her PCP.  Patient does not meet criteria for inpatient psychiatric hospitalization.  Mother and patient would like to follow-up outpatient.  Patient appears overall well.  Patient did give me permission to talk in front of her mother.  She has good support system.  We will start her on hydroxyzine to help with her anxiety.  She will continue taking her Effexor.  I have given her resources for outpatient additional psychiatric services.  She will return for any worsening symptoms.   The patient has been appropriately medically screened and/or stabilized in the ED. I have low suspicion for any other emergent medical condition which would require further screening, evaluation or treatment in the ED or require inpatient management.  Patient is hemodynamically stable and in no acute distress.  Patient able to ambulate in department prior to ED.  Evaluation does not show acute pathology that would require ongoing or additional  emergent interventions while in the emergency department or further inpatient treatment.  I have discussed the diagnosis with the patient and answered all questions.  Pain is been managed while in the emergency department and patient has no further complaints prior to discharge.  Patient is comfortable with plan discussed in room and is stable for discharge at this time.  I have discussed strict return precautions for returning to the emergency department.  Patient was encouraged to follow-up with PCP/specialist refer to at discharge.   MDM Rules/Calculators/A&P                           Final Clinical Impression(s) / ED Diagnoses Final diagnoses:  Anxiety    Rx / DC Orders ED Discharge Orders         Ordered    hydrOXYzine (ATARAX/VISTARIL) 25 MG tablet  Every 6 hours     Discontinue  Reprint     05/06/20 1701    ALPRAZolam (XANAX) 0.25 MG tablet  2 times daily PRN     Discontinue  Reprint     05/06/20 1701           Mael Delap A, PA-C 05/06/20 1705    Alvira Monday, MD 05/07/20 [phone]

## 2020-05-15 ENCOUNTER — Other Ambulatory Visit: Payer: Self-pay

## 2020-05-15 ENCOUNTER — Telehealth: Payer: Self-pay | Admitting: Psychiatry

## 2020-05-15 DIAGNOSIS — F428 Other obsessive-compulsive disorder: Secondary | ICD-10-CM

## 2020-05-15 DIAGNOSIS — F411 Generalized anxiety disorder: Secondary | ICD-10-CM

## 2020-05-15 DIAGNOSIS — F902 Attention-deficit hyperactivity disorder, combined type: Secondary | ICD-10-CM

## 2020-05-15 MED ORDER — VENLAFAXINE HCL ER 75 MG PO CP24: 75.0000 mg | ORAL_CAPSULE | Freq: Every day | ORAL | 0 refills | Status: DC

## 2020-05-15 NOTE — Telephone Encounter (Signed)
Patient did not follow-up from March through August after last appointment and multiple subsequent phone calls changing from her last phone reporting that Effexor stabilized anxiety and academics with success to now [facility] according to ED presentation for anxiety August 18.  They seem to request an interim supply of Effexor 75 mg XR every morning to [facility] until the end of September appointment at [facility] treatment center sent as #[phone]

## 2020-06-23 ENCOUNTER — Other Ambulatory Visit: Payer: Self-pay

## 2020-06-23 DIAGNOSIS — F428 Other obsessive-compulsive disorder: Secondary | ICD-10-CM

## 2020-06-23 DIAGNOSIS — F902 Attention-deficit hyperactivity disorder, combined type: Secondary | ICD-10-CM

## 2020-06-23 DIAGNOSIS — F411 Generalized anxiety disorder: Secondary | ICD-10-CM

## 2020-06-24 DIAGNOSIS — F320 Major depressive disorder, single episode, mild: Secondary | ICD-10-CM | POA: Diagnosis not present

## 2020-06-24 DIAGNOSIS — F411 Generalized anxiety disorder: Secondary | ICD-10-CM | POA: Diagnosis not present

## 2020-06-25 ENCOUNTER — Other Ambulatory Visit: Payer: Self-pay

## 2020-06-25 DIAGNOSIS — F411 Generalized anxiety disorder: Secondary | ICD-10-CM

## 2020-06-25 DIAGNOSIS — F902 Attention-deficit hyperactivity disorder, combined type: Secondary | ICD-10-CM

## 2020-06-25 DIAGNOSIS — F428 Other obsessive-compulsive disorder: Secondary | ICD-10-CM

## 2020-06-30 DIAGNOSIS — F320 Major depressive disorder, single episode, mild: Secondary | ICD-10-CM | POA: Diagnosis not present

## 2020-06-30 DIAGNOSIS — F411 Generalized anxiety disorder: Secondary | ICD-10-CM | POA: Diagnosis not present

## 2020-07-06 DIAGNOSIS — F419 Anxiety disorder, unspecified: Secondary | ICD-10-CM | POA: Diagnosis not present

## 2020-07-06 DIAGNOSIS — N920 Excessive and frequent menstruation with regular cycle: Secondary | ICD-10-CM | POA: Diagnosis not present

## 2020-07-07 DIAGNOSIS — F411 Generalized anxiety disorder: Secondary | ICD-10-CM | POA: Diagnosis not present

## 2020-07-07 DIAGNOSIS — F320 Major depressive disorder, single episode, mild: Secondary | ICD-10-CM | POA: Diagnosis not present

## 2020-07-08 ENCOUNTER — Encounter: Payer: Self-pay | Admitting: Psychiatry

## 2020-07-10 ENCOUNTER — Other Ambulatory Visit: Payer: Self-pay

## 2020-07-10 DIAGNOSIS — F411 Generalized anxiety disorder: Secondary | ICD-10-CM

## 2020-07-10 DIAGNOSIS — F902 Attention-deficit hyperactivity disorder, combined type: Secondary | ICD-10-CM

## 2020-07-10 DIAGNOSIS — F428 Other obsessive-compulsive disorder: Secondary | ICD-10-CM

## 2020-07-14 DIAGNOSIS — F320 Major depressive disorder, single episode, mild: Secondary | ICD-10-CM | POA: Diagnosis not present

## 2020-07-14 DIAGNOSIS — F411 Generalized anxiety disorder: Secondary | ICD-10-CM | POA: Diagnosis not present

## 2020-07-21 DIAGNOSIS — F411 Generalized anxiety disorder: Secondary | ICD-10-CM | POA: Diagnosis not present

## 2020-07-21 DIAGNOSIS — F320 Major depressive disorder, single episode, mild: Secondary | ICD-10-CM | POA: Diagnosis not present

## 2020-07-29 DIAGNOSIS — R112 Nausea with vomiting, unspecified: Secondary | ICD-10-CM | POA: Diagnosis not present

## 2020-07-29 DIAGNOSIS — R195 Other fecal abnormalities: Secondary | ICD-10-CM | POA: Diagnosis not present

## 2020-07-30 DIAGNOSIS — F320 Major depressive disorder, single episode, mild: Secondary | ICD-10-CM | POA: Diagnosis not present

## 2020-07-30 DIAGNOSIS — F411 Generalized anxiety disorder: Secondary | ICD-10-CM | POA: Diagnosis not present

## 2020-08-04 DIAGNOSIS — F320 Major depressive disorder, single episode, mild: Secondary | ICD-10-CM | POA: Diagnosis not present

## 2020-08-04 DIAGNOSIS — F411 Generalized anxiety disorder: Secondary | ICD-10-CM | POA: Diagnosis not present

## 2020-08-11 DIAGNOSIS — F411 Generalized anxiety disorder: Secondary | ICD-10-CM | POA: Diagnosis not present

## 2020-08-11 DIAGNOSIS — F320 Major depressive disorder, single episode, mild: Secondary | ICD-10-CM | POA: Diagnosis not present

## 2020-08-18 DIAGNOSIS — F411 Generalized anxiety disorder: Secondary | ICD-10-CM | POA: Diagnosis not present

## 2020-08-18 DIAGNOSIS — Z20828 Contact with and (suspected) exposure to other viral communicable diseases: Secondary | ICD-10-CM | POA: Diagnosis not present

## 2020-08-18 DIAGNOSIS — F320 Major depressive disorder, single episode, mild: Secondary | ICD-10-CM | POA: Diagnosis not present

## 2020-08-25 DIAGNOSIS — F411 Generalized anxiety disorder: Secondary | ICD-10-CM | POA: Diagnosis not present

## 2020-08-25 DIAGNOSIS — F320 Major depressive disorder, single episode, mild: Secondary | ICD-10-CM | POA: Diagnosis not present

## 2020-09-01 DIAGNOSIS — F411 Generalized anxiety disorder: Secondary | ICD-10-CM | POA: Diagnosis not present

## 2020-09-01 DIAGNOSIS — F320 Major depressive disorder, single episode, mild: Secondary | ICD-10-CM | POA: Diagnosis not present

## 2020-09-07 DIAGNOSIS — Z13220 Encounter for screening for lipoid disorders: Secondary | ICD-10-CM | POA: Diagnosis not present

## 2020-09-07 DIAGNOSIS — Z23 Encounter for immunization: Secondary | ICD-10-CM | POA: Diagnosis not present

## 2020-09-07 DIAGNOSIS — Z0000 Encounter for general adult medical examination without abnormal findings: Secondary | ICD-10-CM | POA: Diagnosis not present

## 2020-09-08 DIAGNOSIS — F411 Generalized anxiety disorder: Secondary | ICD-10-CM | POA: Diagnosis not present

## 2020-09-08 DIAGNOSIS — F320 Major depressive disorder, single episode, mild: Secondary | ICD-10-CM | POA: Diagnosis not present

## 2020-09-22 DIAGNOSIS — F411 Generalized anxiety disorder: Secondary | ICD-10-CM | POA: Diagnosis not present

## 2020-09-22 DIAGNOSIS — F320 Major depressive disorder, single episode, mild: Secondary | ICD-10-CM | POA: Diagnosis not present

## 2020-09-28 ENCOUNTER — Ambulatory Visit: Payer: BC Managed Care – PPO | Admitting: Family Medicine

## 2020-09-28 ENCOUNTER — Telehealth: Payer: Self-pay | Admitting: Family Medicine

## 2020-09-28 NOTE — Telephone Encounter (Signed)
Requested records received from [place] at [facility]

## 2020-10-01 ENCOUNTER — Other Ambulatory Visit: Payer: Self-pay

## 2020-10-01 ENCOUNTER — Telehealth (INDEPENDENT_AMBULATORY_CARE_PROVIDER_SITE_OTHER): Payer: BC Managed Care – PPO | Admitting: Family Medicine

## 2020-10-01 ENCOUNTER — Encounter: Payer: Self-pay | Admitting: Family Medicine

## 2020-10-01 VITALS — Ht 65.0 in | Wt 134.0 lb

## 2020-10-01 DIAGNOSIS — U071 COVID-19: Secondary | ICD-10-CM | POA: Diagnosis not present

## 2020-10-01 DIAGNOSIS — F428 Other obsessive-compulsive disorder: Secondary | ICD-10-CM | POA: Diagnosis not present

## 2020-10-01 DIAGNOSIS — F411 Generalized anxiety disorder: Secondary | ICD-10-CM | POA: Diagnosis not present

## 2020-10-01 NOTE — Progress Notes (Unsigned)
Start time: 3:50 End time: 4:46   Virtual Visit via Video Note  I connected with Kathryn Blanchard on 10/01/20 by a video enabled telemedicine application and verified that I am speaking with the correct person using two identifiers.  Location: Patient: at [place] Provider: office   I discussed the limitations of evaluation and management by telemedicine and the availability of in person appointments. The patient expressed understanding and agreed to proceed.  History of Present Illness:  Chief Complaint  Patient presents with  . Establish Care    VIRTUAL to establish and discuss anxiety.    Patient presents to establish care.  Visit was changed to virtual after testing positive for COVID.  Her main issue she would like to discuss is her anxiety. Per review of information already in epic, there is report of OCD as well. She reports that she is trying to take a holistic approach, not wanting meds.  She reports she took Effexor XR 75mg  for about a year. Stopped in September, and has been doing fairly well. Started on zoloft 05/2019 for anxiety, changed to effexor (zoloft wasn't effective, didn't go above 50mg , per pt's recall).  Has been off Effexor for 3-4 months, and can't tell how much it helped.  Had some dizziness when she stopped it, didn't taper. She had been under the care of Dr. 06/2019.  She had also been seeing a therapist, last seen before Christmas.  She doesn't like doing virtual visits.  Doesn't want to continue with either of these providers at this time.  She is hoping for in-person visits in future, but "needs a break right now."  She is doing much better, got benefit from the therapy she received.  She used to struggle to find things she enjoyed, lacked motivation. Now enjoys yoga, does a few times/week. Loves to make candles. She does a lot of walking, mainly to/from class. Kathryn Blanchard at 04-11-1973.  Still has xanax and hydroxyzine (only given #5  and #20), which she got from the ER at a visit in 04/2020 when having a lot of anxiety.  She just wants to make sure that if more is needed, that she can ask for refills from our office. She hasn't had recent panic attack.  She does report sometimes feeling "stuck", and when further described, is related to OCD.  COVID +. They recently lost a close family friend, and many people that had gathered and attended the funeral have come down with COVID.  She reports that her current symptoms include minor cold symptoms, muscle pains in her back.  On review of her PMH, noted "diaphoresis"--she states she sweats a lot, especially at night. This has been going on for a year, needs to wash her sheets a lot.  She mainly sweats in her sleep, just a few times/week.  Unchanged.  Immunizations--none in epic. She reports she has had COVD vaccines x3, and that she got tetanus booster at her physical at [facility] in 08/2020.  We had received records from [facility], but while these included LABS from 08/2020 visit, it didn't include the notes from that visit (last note was from 07/2020).  Past Medical History:  Diagnosis Date  . Acne vulgaris   . ADHD (attention deficit hyperactivity disorder)   . Diaphoresis   . Fatigue   . Generalized anxiety disorder    h/o panic attacks  . Obsessive-compulsive disorder    History reviewed. No pertinent surgical history.  Social History   Tobacco Use  .  Smoking status: Never Smoker  . Smokeless tobacco: Never Used  Vaping Use  . Vaping Use: Some days  . Substances: Nicotine  Substance Use Topics  . Alcohol use: Yes    Alcohol/week: 3.0 standard drinks    Types: 3 Standard drinks or equivalent per week    Comment: 1-2 nights/week at college (2-3 drinks each night)  . Drug use: Not Currently    Types: Marijuana   She reports she cut back and is no longer vaping.    Social History   Social History Narrative   In social relationships on her college campus.  No  further info given.   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  In the process, she has had to face family trauma of a cousin impacting negative publicity upon the family for child maltreatment at preschool and mother's job there was therefore impacted as well.  The patient has subsequently experienced the separation of parents father moving out not divorced, brother needing therapy because of parents conflicts, and patient now seeking treatment though she decided against any further treatment after 1 therapy session 1 year ago.  Patient has had a 30 pound weight loss with anxiety at college having a close friend Morrie Sheldon in whom she can confide but always feels bad about being a burden with her problems.  Alcohol, cannabis, and vaping nicotine are all relaxing in modest amounts though higher amounts of cannabis elicit prepanic with rapid heartbeat the patient's usual 3 episodes of panic have been vestibular with dimming of vision feeling like passing out.      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 Lives in an apartment with roommate, Maurice March.   Lives with parents, brother (older at [employer]).   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 Anxiety disorder Mother   . ADD / ADHD Mother   . Depression Mother   . Asthma Mother   . OCD Father   . Hypertension Father   . Hypercholesterolemia Father   . Alcohol abuse Neg Hx   . Bipolar disorder Neg Hx   . Dementia Neg Hx   . Drug abuse Neg Hx   . Paranoid behavior Neg Hx   . Physical abuse Neg Hx   . Schizophrenia Neg Hx   . Seizures Neg Hx   . Sexual abuse Neg Hx   . Cancer Neg Hx   . Diabetes Neg Hx   . Heart disease Neg Hx    Outpatient Encounter Medications as of 10/01/2020  Medication Sig Note  . APRI 0.15-30 MG-MCG tablet Take 1 tablet by  mouth daily.   Marland Kitchen ALPRAZolam (XANAX) 0.25 MG tablet Take 1 tablet (0.25 mg total) by mouth 2 (two) times daily as needed for anxiety. (Patient not taking: Reported on 10/01/2020) 10/01/2020: Uses prn, rarely, for panic  . hydrOXYzine (ATARAX/VISTARIL) 25 MG tablet Take 1 tablet (25 mg total) by mouth every 6 (six) hours. (Patient not taking: Reported on 10/01/2020) 10/01/2020: Uses prn anxiety  . [DISCONTINUED] venlafaxine XR (EFFEXOR-XR) 75 MG 24 hr capsule Take 1 capsule (75 mg total) by mouth daily with breakfast.    No facility-administered encounter medications on file as of 10/01/2020.   No Known Allergies  ROS: no fever, chills.  Mild URI symptoms and myalgia per HPI, +COVID. Denies chest pain, shortness of breath, nausea, vomiting,  diarrhea, rash. +anxiety, OCD, no depression.   See HPI   Observations/Objective:  Ht 5\' 5"  (1.651 m)   Wt 134 lb (60.8 kg)   LMP 09/20/2020 (Approximate)   BMI 22.30 kg/m   Pleasant, well-appearing female, in good spirits. She is alert and oriented.  She has normal eye contact, speech. Normal grooming.  Normal mood, affect. She is not coughing, speaking easily, and in no distress. Exam is limited due to virtual nature of the visit.  GAD-7 score of 6  Assessment and Plan:  Generalized anxiety disorder - She has developed good coping/relaxation skills, not self-medicating with substances. Has prn meds which are effective. Desires staying off Effexor for now  Other obsessive-compulsive disorder - She describes some symptoms, likely relates/triggers anxiety. Advised this is harder to treat, and if impacting her, may need to restart meds  COVID-19 virus infection - symptoms are mild, briefly discussed supportive measures  She was reassured that she could contact 11/18/2020 for refill on alprazolam or hydroxyzine if/when needed.  She clearly isn't overusing medications. Reinforced mindfulness, stress reduction techniques, exercise. Reviewed techniques she has  learned, and discussed some others. Discussed OCD, and how this may be harder to treat without medication--may need to consider restarting Effexor if impacting her daily activities, worsening anxiety. We did not really touch on her prior h/o ADHD today, as she wasn't reporting any problems related to this, and is wanting to limit her medications.  Recommended f/u over the summer. I suggested CPE, as she will be due for a pap smear, though if she had CPE through [facility] in December, will either need to delay or check with insurance if it is per calendar year (didn't get December notes from [facility]--will request, and will look into [facility] from her immunizations).   Follow Up Instructions:    I discussed the assessment and treatment plan with the patient. The patient was provided an opportunity to ask questions and all were answered. The patient agreed with the plan and demonstrated an understanding of the instructions.   The patient was advised to call back or seek an in-person evaluation if the symptoms worsen or if the condition fails to improve as anticipated.  I spent 60 minutes dedicated to the care of this patient, including pre-visit review of records, face to face time, post-visit ordering of testing and documentation.    [place], MD

## 2020-10-02 ENCOUNTER — Encounter: Payer: Self-pay | Admitting: Family Medicine

## 2020-10-02 NOTE — Patient Instructions (Signed)
It was a pleasure meeting you. Please reach out if you are having increasing problems with anxiety or OCD, so that we can restart Effexor.  You seem to be doing well, and have developed good habits that are helping you. You may contact us if/when you need more of either the alprazolam or hydroxyzine.  I hope your symptoms of COVID resolve quickly. Isolation is a minimum of 5 days. If you are significantly improved by 5 days, you can end isolation and wear masks faithfully for the next 5 days, vs staying isolated for the full 10 days (which is needed if your symptoms are not improving--yours are so mild to begin with, I suspect you'll be fine at day 5). The CDC website has the updated guidelines, if you have any questions about this.

## 2020-10-13 ENCOUNTER — Telehealth: Payer: Self-pay

## 2020-10-13 NOTE — Telephone Encounter (Signed)
Called pt to schedule CPE per Dr. Delford Field notes, no answer and mail box full.

## 2020-10-13 NOTE — Telephone Encounter (Signed)
Called pt to schedule CPE per Dr. Knapp's notes, no answer and mail box full.   

## 2020-10-13 NOTE — Progress Notes (Signed)
I called pt unable to reach by phone, sent mychart message

## 2020-10-20 NOTE — Progress Notes (Signed)
Also sent pt a mychart message to call

## 2020-10-20 NOTE — Progress Notes (Signed)
Called but was unable to leave a message for pt will try another time

## 2020-11-03 ENCOUNTER — Other Ambulatory Visit: Payer: Self-pay | Admitting: Internal Medicine

## 2020-11-06 DIAGNOSIS — Z20828 Contact with and (suspected) exposure to other viral communicable diseases: Secondary | ICD-10-CM | POA: Diagnosis not present

## 2021-01-06 ENCOUNTER — Encounter: Payer: Self-pay | Admitting: Family Medicine

## 2021-01-10 NOTE — Progress Notes (Signed)
Start time: 4:06 End time: 4:47  Virtual Visit via Video Note  I connected with Kathryn Blanchard on 01/11/21 by a video enabled telemedicine application and verified that I am speaking with the correct person using two identifiers.  Location: Patient: Apartment in [place] Provider: office   I discussed the limitations of evaluation and management by telemedicine and the availability of in person appointments. The patient expressed understanding and agreed to proceed.  History of Present Illness:  Chief Complaint  Patient presents with  . Anxiety    VIRTUAL appointment to discuss anxiety.     Patient scheduled visit to discuss going back on anxiety medications. She will be studying in [place] for a month at the end of May. She is currently stressed, having a tough semester (harder classes).  Harder time balancing work, school, social. She finds that she bottles up emotions, then "implodes"--crying more recently. Feels destructured, disorganized  Sleep--some nights have trouble falling asleep, wakes up early (morning person, always up at 7:30.). She is struggling 2-3x/week to fall asleep. Often exhausted and sleeps easily.  Still doing yoga (in own apartment) Active during the week, takes walks outside. Saw therapist for a few  Months ([facility]), last in January. Has alprazolam and hydroxyzine from ER 04/2020--Not used recently, still has some meds.  Got some shift coverage when needed to for projects. 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  She is anxious and excited about this.  Per 09/2020 visit:  Pt with h/o anxiety and OCD.  She had been on effexor XR 75mg  for about a year (zoloft 50mg  wasn't effective), went off in 05/2020, wanting to take a more holistic approach, without meds.  When seen in January, had been off for a few months, and was doing well.  She previously lacked motivation, struggled to find things she enjoyed, but in January was  enjoying yoga a few times/week, making candles, working.  She found the therapy she had gotten in the past helpful (needed a break, didn't really like virtual visits). She had described sometimes feeling "stuck", and when further described, was related to OCD.  Today she recalls that when she took Effexor--felt "in the middle of the spectrum", felt a big difference. Motivation was stronger. Didn't recall any side effects.   PMH, PSH, SH reviewed  Outpatient Encounter Medications as of 01/11/2021  Medication Sig Note  . APRI 0.15-30 MG-MCG tablet Take 1 tablet by mouth daily.   . [DISCONTINUED] desogestrel-ethinyl estradiol (APRI) 0.15-30 MG-MCG tablet 1 tablet   . ALPRAZolam (XANAX) 0.25 MG tablet Take 1 tablet (0.25 mg total) by mouth 2 (two) times daily as needed for anxiety. (Patient not taking: No sig reported) 10/01/2020: Uses prn, rarely, for panic  . hydrOXYzine (ATARAX/VISTARIL) 25 MG tablet Take 1 tablet (25 mg total) by mouth every 6 (six) hours. (Patient not taking: No sig reported) 10/01/2020: Uses prn anxiety  . [DISCONTINUED] venlafaxine (EFFEXOR) 75 MG tablet Take by mouth.    No facility-administered encounter medications on file as of 01/11/2021.   No Known Allergies  ROS: no fever, chills, headaches, URI symptoms.  +stress, anxiety, occasional trouble sleeping.   Observations/Objective:  Ht 5\' 5"  (1.651 m)   Wt 134 lb (60.8 kg) Comment: January  LMP 01/11/2021   BMI 22.30 kg/m   Well-dressed, well-appearing female, in good spirits, in no distress. She is alert, oriented. Normal eye contact, speech, grooming. Exam is limited due to virtual nature of  the visit.  GAD-7 score of 9   Assessment and Plan:  Generalized anxiety disorder - discussed expectations, risks/SE of meds, when to use xanax, hydroxyzine and when effexor will start working - Plan: venlafaxine XR (EFFEXOR XR) 37.5 MG 24 hr capsule, venlafaxine XR (EFFEXOR XR) 75 MG 24 hr capsule  Restart  Effexor--37.5mg  x 1 week, then double up to 75mg   #30 Also rx'd 75mg   #90 (though it not tolerating, will contact before her trip, to fill #90 of the lower dose).  Counseled re: other stress-reducing/relaxation techniques, sleep, regular exercise.  Risks/SE of all meds reviewed in detail. To consider resuming counseling if not improving.  Has f/u end of June for CPE.   Follow Up Instructions:    I discussed the assessment and treatment plan with the patient. The patient was provided an opportunity to ask questions and all were answered. The patient agreed with the plan and demonstrated an understanding of the instructions.   The patient was advised to call back or seek an in-person evaluation if the symptoms worsen or if the condition fails to improve as anticipated.  I spent 43 minutes dedicated to the care of this patient, including pre-visit review of records, face to face time, post-visit ordering of testing and documentation.    [place], MD

## 2021-01-11 ENCOUNTER — Other Ambulatory Visit: Payer: Self-pay

## 2021-01-11 ENCOUNTER — Telehealth (INDEPENDENT_AMBULATORY_CARE_PROVIDER_SITE_OTHER): Payer: BC Managed Care – PPO | Admitting: Family Medicine

## 2021-01-11 ENCOUNTER — Encounter: Payer: Self-pay | Admitting: Family Medicine

## 2021-01-11 VITALS — Ht 65.0 in | Wt 134.0 lb

## 2021-01-11 DIAGNOSIS — F411 Generalized anxiety disorder: Secondary | ICD-10-CM

## 2021-01-11 MED ORDER — VENLAFAXINE HCL ER 75 MG PO CP24: 75.0000 mg | ORAL_CAPSULE | Freq: Every day | ORAL | 0 refills | Status: DC

## 2021-01-11 MED ORDER — VENLAFAXINE HCL ER 37.5 MG PO CP24: ORAL_CAPSULE | ORAL | 0 refills | Status: DC

## 2021-01-11 NOTE — Patient Instructions (Signed)
Start Effexor XR 37.5 once daily (you may want to wait until the weekend, in case there are any side effects).   After 1 week, if tolerating the medication without significant side effects, increase up to 2 pills daily (okay to wait longer, if still having some side effects at a week). You may have slight recurrence of any side effects you had upon starting the 37.5mg  dose as it is doubled, but should improve quickly. If you cannot tolerate the 75mg  dose (any side effects don't resolve), then you can stay at the lower dose.  We discussed taking it in the morning, trying to be fairly consistent in the time you take it (and the potential for headaches if you forget to take it).  Use the xanax (alprazolam) only if feeling very anxious, panicky, can't control crying.  Do not drive while taking this medication, or mix with any alcohol. You can use the hydroxyzine when you're struggling to shut your mind down to relax and go to bed.  You may feel groggy in the  Morning, so be sure to have 8 hours before you need to be up, driving, without feeling tired.    Reach out if you have any questions or concerns. Have a great trip!

## 2021-01-25 ENCOUNTER — Other Ambulatory Visit: Payer: Self-pay | Admitting: Family Medicine

## 2021-01-25 DIAGNOSIS — F411 Generalized anxiety disorder: Secondary | ICD-10-CM

## 2021-03-16 NOTE — Progress Notes (Deleted)
  Kathryn Blanchard is a 20 y.o. female who presents for a complete physical.   She has the following concerns:  She just recently got back from a month in [place]  Follow-up on Anxiety:  Last visit was 2 months ago, when she reported being ready to restart medication for anxiety. At that time she felt disorganized, destructured, had been crying more, felt like she bottles up her emotions and then "implodes".  She was having some trouble falling asleep about 2-3x/week.  She was doing yoga in her apartment.  Hadn't seen her therapist in a few months (last was in 09/2020 at the time of her April appt).  GAD-7 score was 9. She previously tolerated Effexor (recalls no side effects, made a "big difference", motivation was stronger), so this was restarted.  She was prescribed 37.$RemoveBeforeDEI'5mg'[id number]$  to start and also a 90d rx for the $Remov'75mg'jFyAai$  dose.  Immunization History  Administered Date(s) Administered   DTaP 06/19/2000, 08/22/2000, 10/26/2000, 10/04/2001, 06/04/2004   HPV Quadrivalent 05/28/2012, 07/30/2012, 11/28/2012   Hepatitis A, Ped/Adol-2 Dose 05/28/2007, 07/28/2008   Hepatitis B, ped/adol 2000/02/19, 05/16/2000, 01/15/2001   HiB (PRP-T) 06/19/2000, 08/22/2000, 10/26/2000, 10/04/2001   IPV 06/19/2000, 08/22/2000, 10/26/2000, 06/04/2004   Influenza Nasal 05/28/2007, 07/28/2008, 07/29/2009, 08/03/2010, 09/09/2011   Influenza Split 08/03/2020   Influenza,Quad,Nasal, Live 10/28/2014   Influenza,inj,Quad PF,6+ Mos 08/17/2017   Influenza-Unspecified 05/28/2012, 08/08/2013   MMR 04/16/2001, 06/04/2004   Meningococcal B, OMV 08/17/2017, 09/18/2017   Meningococcal Conjugate 05/28/2012, 08/17/2017   PFIZER(Purple Top)SARS-COV-2 Vaccination 12/31/2019, 01/22/2020, 09/08/2020   Pneumococcal-Unspecified 07/17/2000, 10/16/2000, 01/15/2001   Tdap 08/03/2010, 09/07/2020   Varicella 04/16/2001, 05/04/2006   Last Pap smear: never Last mammogram: never  Alcohol: Drugs: Sexually Active ? Lifetime  partners  Dentist: Ophtho: Exercise:  Labs: 08/2020: lipids Tchol 186, TG 95, HDL 64, LDL 104, ratio 2.9 07/2020 normal CBC (Hg 13.5), c-met, normal celiac labs 06/2020: TSH 0.57 08/2017: Vitamin D-OH 26.3   PMH, PSH, SH and FH were reviewed and updated    ROS:  The patient denies anorexia, fever, weight changes, headaches,  vision changes, decreased hearing, ear pain, sore throat, breast concerns, chest pain, palpitations, dizziness, syncope, dyspnea on exertion, cough, swelling, nausea, vomiting, diarrhea, constipation, abdominal pain, melena, hematochezia, indigestion/heartburn, hematuria, incontinence, dysuria, irregular menstrual cycles, vaginal discharge, odor or itch, genital lesions, joint pains, numbness, tingling, weakness, tremor, suspicious skin lesions, depression,abnormal bleeding/bruising, or enlarged lymph nodes. Anxiety   PHYSICAL EXAM:   ASSESSMENT/PLAN:

## 2021-03-17 ENCOUNTER — Encounter: Payer: BC Managed Care – PPO | Admitting: Family Medicine

## 2021-03-17 ENCOUNTER — Telehealth: Payer: Self-pay | Admitting: *Deleted

## 2021-03-17 DIAGNOSIS — Z0000 Encounter for general adult medical examination without abnormal findings: Secondary | ICD-10-CM

## 2021-03-17 NOTE — Telephone Encounter (Signed)

## 2021-03-17 NOTE — Telephone Encounter (Signed)
No show letter and fee.  She missed her CPE today.  She can c/b to reschedule it. (I believe she recently spent a month in [place], was supposed to be back already, already had visit scheduled prior to her leaving)

## 2021-03-30 ENCOUNTER — Encounter: Payer: Self-pay | Admitting: Family Medicine

## 2021-04-10 ENCOUNTER — Other Ambulatory Visit: Payer: Self-pay | Admitting: Family Medicine

## 2021-04-10 DIAGNOSIS — F411 Generalized anxiety disorder: Secondary | ICD-10-CM

## 2021-08-08 NOTE — Progress Notes (Signed)
Start time: 1:28 End time: 1:55  Virtual Visit via Video Note  I connected with Kathryn Blanchard on 08/09/21 by a video enabled telemedicine application and verified that I am speaking with the correct person using two identifiers.  Location: Patient: in bedroom at house in [place], [place]. Coming back to [facility] tomorrow. Provider: office   I discussed the limitations of evaluation and management by telemedicine and the availability of in person appointments. The patient expressed understanding and agreed to proceed.  History of Present Illness:  Chief Complaint  Patient presents with   Anxiety    VIRTUAL for anxiety medication refill, no other concerns. Will get flu shot when she comes home for winter break.     Patient presents for f/u on anxiety, in need of refills of effexor.  She was last seen 12/2020 via video visit. She has actually never been seen in person at our office, she no-showed her CPE scheduled for June 2022.   Pt with h/o anxiety and OCD.  She had been on effexor XR 75mg  for about a year (zoloft 50mg  wasn't effective), went off in 05/2020, wanting to take a more holistic approach, without meds.  She was doing okay when seen in January 2022, but ultimately decided to get back on the medication in April 2022.  She was going to be studying abroad in [place]24-[place], was excited and anxious about this.  She was having some trouble falling asleep a few times/week. GAD-7 score had increased from 6 in 09/2020 to 9 in April, with more trouble relaxing and more worrying.  She had a great time in 10/2020, the medications helped a lot with her anxiety. Medication helps prevent her from getting snappy, irritable and to her "breaking point" At first she stated that she no longer was sweating and having physical response. Later stated that she still does have some of these physical responses to her anxiety, but much less than prior to taking medication. She feels like her anxiety is a little higher currently,  due to stress (workload, she is a [place], hard part of the semester, applying for jobs).  She overall feels like she is handling things ok currently, but reports have "pointless breakdowns at least weekly." Medications keep her more "balanced out". She is a [place], applying for jobs. Denies side effects. No n/v. Denies headaches. Some sweating in sleep, since sophomore year (even when not on med)  PMH, PSH, SH reviewed  Outpatient Encounter Medications as of 08/09/2021  Medication Sig Note   APRI 0.15-30 MG-MCG tablet Take 1 tablet by mouth daily.    [DISCONTINUED] venlafaxine XR (EFFEXOR-XR) 75 MG 24 hr capsule TAKE 1 CAPSULE BY MOUTH DAILY WITH BREAKFAST.    ALPRAZolam (XANAX) 0.25 MG tablet Take 1 tablet (0.25 mg total) by mouth 2 (two) times daily as needed for anxiety. (Patient not taking: Reported on 10/01/2020) 10/01/2020: Uses prn, rarely, for panic   hydrOXYzine (ATARAX/VISTARIL) 25 MG tablet Take 1 tablet (25 mg total) by mouth every 6 (six) hours. (Patient not taking: Reported on 10/01/2020) 10/01/2020: Uses prn anxiety   venlafaxine XR (EFFEXOR-XR) 150 MG 24 hr capsule Take 1 capsule (150 mg total) by mouth daily with breakfast.    [DISCONTINUED] venlafaxine XR (EFFEXOR XR) 37.5 MG 24 hr capsule Take 1 capsule by mouth daily.  If tolerated, after 1 week, increase to 2 capsules    No facility-administered encounter medications on file as of 08/09/2021.   Taking 75mg  of Effexor prior to today's visit  No Known Allergies  ROS: no fever, chills, headaches, URI symptoms, depression. Anxiety is improved, per HPI.  Some sweating only when anxious, no chest pain or other concerns.     Observations/Objective:  Ht 5\' 5"  (1.651 m)   Wt 140 lb (63.5 kg)   LMP 08/09/2021   BMI 23.30 kg/m   Well-appearing, pleasant female, in good spirits. She is alert, oriented, cranial nerves are grossly intact. Normal speech, eye contact, grooming Normal mood, affect.  GAD 7 : Generalized  Anxiety Score 08/09/2021 01/11/2021 10/01/2020  Nervous, Anxious, on Edge 1 1 1   Control/stop worrying 1 1 1   Worry too much - different things 1 2 1   Trouble relaxing 1 2 1   Restless 1 1 0  Easily annoyed or irritable 1 2 2   Afraid - awful might happen 0 0 0  Total GAD 7 Score 6 9 6   Anxiety Difficulty Somewhat difficult - -     Assessment and Plan:  Generalized anxiety disorder - improved, but suboptimally.  Trial of increase to 150mg  dose of Effexor. f/u 6 weeks.  Risks/SE reviewed - Plan: venlafaxine XR (EFFEXOR-XR) 150 MG 24 hr capsule  Discussed need for in-person visit for vital signs, examination. She no-showed her CPE, recommended r/s. She is unsure if planning to see GYN or get pap here (advised that we would be happy to do here). If seeing GYN, may not need to schedule CPE with 10/03/2020 (just the in-person med check).   Follow Up Instructions:    I discussed the assessment and treatment plan with the patient. The patient was provided an opportunity to ask questions and all were answered. The patient agreed with the plan and demonstrated an understanding of the instructions.   The patient was advised to call back or seek an in-person evaluation if the symptoms worsen or if the condition fails to improve as anticipated.  I spent 31 minutes dedicated to the care of this patient, including pre-visit review of records, face to face time, post-visit ordering of testing and documentation.    , MD

## 2021-08-09 ENCOUNTER — Other Ambulatory Visit: Payer: Self-pay

## 2021-08-09 ENCOUNTER — Encounter: Payer: Self-pay | Admitting: Family Medicine

## 2021-08-09 ENCOUNTER — Telehealth (INDEPENDENT_AMBULATORY_CARE_PROVIDER_SITE_OTHER): Payer: BC Managed Care – PPO | Admitting: Family Medicine

## 2021-08-09 VITALS — Ht 65.0 in | Wt 140.0 lb

## 2021-08-09 DIAGNOSIS — F411 Generalized anxiety disorder: Secondary | ICD-10-CM

## 2021-08-09 MED ORDER — VENLAFAXINE HCL ER 150 MG PO CP24: 150.0000 mg | ORAL_CAPSULE | Freq: Every day | ORAL | 1 refills | Status: DC

## 2021-08-09 NOTE — Patient Instructions (Signed)
We discussed trying the 150 mg dose of venlafaxine, since you still have some underlying anxiety (though improved). You can take two of the 75mg  tablets that you have at home, and pick up the 150mg  dose from [facility] later this week. Expect the possibility of mild side effects (if you had any when you first started this medication) with the higher dose, and should improve after a few days.  If you are having significant problems, please let know.  Follow up in the office in 4-6 weeks. I do need to do a physical exam (listen to your heart, etc), along with blood pressure, weight (vital signs).  I'd like for you to schedule for a complete physical when you are home at some point (whether we do your pap smear or you see a GYN is up to you.)

## 2021-09-14 NOTE — Progress Notes (Signed)
Chief Complaint  Patient presents with   other    Med check no other issues at this time. Pt. Had flu shot last oct. [facility], pt. Has had pfizer two shots and 1 booster    Patient presents for 6 week follow-up.  At her virtual visit in November, she reported increased anxiety due to stress (workload, she is a Holiday representative, hard part of the semester, applying for jobs). She thought overall she was handling things ok, but also reported having "pointless breakdowns at least weekly." She had said that since being back on medication, she felt more "balanced out". At that time, her GAD-7 score was 6. We increased her Effexor from 75 mg to 150mg .  She presents today for f/u on the dose change.  She denies any SE to medications.  No vomiting or HA.  She felt nauseated 3x since home for break--sometimes could be related to not eating with the medication.  It is short-lived, and r/b eating. She missed taking medication one day, and felt dizzier that day.  She is now home on break, so less stressed. Overall she feels better.  She notes that she is less snappy/irritable with her family. Hasn't cried in a while, less sweats.   PMH, PSH, SH reviewed  Outpatient Encounter Medications as of 09/15/2021  Medication Sig Note   APRI 0.15-30 MG-MCG tablet Take 1 tablet by mouth daily.    [DISCONTINUED] venlafaxine XR (EFFEXOR-XR) 150 MG 24 hr capsule Take 1 capsule (150 mg total) by mouth daily with breakfast.    ALPRAZolam (XANAX) 0.25 MG tablet Take 1 tablet (0.25 mg total) by mouth 2 (two) times daily as needed for anxiety. (Patient not taking: Reported on 10/01/2020) 10/01/2020: Uses prn, rarely, for panic   hydrOXYzine (ATARAX/VISTARIL) 25 MG tablet Take 1 tablet (25 mg total) by mouth every 6 (six) hours. (Patient not taking: Reported on 10/01/2020) 10/01/2020: Uses prn anxiety   venlafaxine XR (EFFEXOR-XR) 150 MG 24 hr capsule Take 1 capsule (150 mg total) by mouth daily with breakfast.     No facility-administered encounter medications on file as of 09/15/2021.   No Known Allergies  ROS: no fever, chills, URI symptoms, headaches, chest pain, shortness of breath. Some dizziness and nausea and sweats per HPI. No weight changes  (lost 20# related to anxiety when she first started college), gained some back. The weights listed under virtual visits aren't accurate--didn't have scale, and gave numbers from doctors visits that were not current.   PHYSICAL EXAM:  BP 118/76    Pulse 80    Temp 98.3 F (36.8 C)    Ht 5' 6.25" (1.683 m)    Wt 153 lb 12.8 oz (69.8 kg)    BMI 24.64 kg/m   Wt Readings from Last 3 Encounters:  09/15/21 153 lb 12.8 oz (69.8 kg)  08/09/21 140 lb (63.5 kg)  01/11/21 134 lb (60.8 kg)   Last weight of 140# was at a doctor's office (long time ago--doesn't have a scale).  Well-appearing, pleasant female, in good spirits HEENT: conjunctiva and sclera are clear, EOMI, wearing mask Neck: no lymphadenopathy thyromegaly or mass Heart: regular rate and rhythm Lungs: clear bilaterally Back: no spinal or CVA tenderness Abdomen: soft, nontender, no organomegaly or mass Extremities: no edema Psych: normal mood, affect, hygiene and grooming. Normal eye contact, speech. Neuro: alert and oriented, normal gait, strength. Skin: no rashes/lesions noted.    GAD 7 : Generalized Anxiety Score 09/15/2021 08/09/2021 01/11/2021 10/01/2020  Nervous, Anxious, on  Edge 1 1 1 1   Control/stop worrying 0 1 1 1   Worry too much - different things 0 1 2 1   Trouble relaxing 1 1 2 1   Restless 0 1 1 0  Easily annoyed or irritable 1 1 2 2   Afraid - awful might happen 0 0 0 0  Total GAD 7 Score 3 6 9 6   Anxiety Difficulty Somewhat difficult Somewhat difficult - -      ASSESSMENT/PLAN:  Generalized anxiety disorder - improved. Continue Effexor 150mg  daily. f/u at CPE in 6 months, sooner prn - Plan: venlafaxine XR (EFFEXOR-XR) 150 MG 24 hr capsule  Need for COVID-19  vaccine - Plan:   F/u 6 months for CPE and med check, sooner prn.

## 2021-09-15 ENCOUNTER — Encounter: Payer: Self-pay | Admitting: Family Medicine

## 2021-09-15 ENCOUNTER — Other Ambulatory Visit: Payer: Self-pay

## 2021-09-15 ENCOUNTER — Ambulatory Visit (INDEPENDENT_AMBULATORY_CARE_PROVIDER_SITE_OTHER): Payer: BC Managed Care – PPO | Admitting: Family Medicine

## 2021-09-15 VITALS — BP 118/76 | HR 80 | Temp 98.3°F | Ht 66.25 in | Wt 153.8 lb

## 2021-09-15 DIAGNOSIS — F411 Generalized anxiety disorder: Secondary | ICD-10-CM | POA: Diagnosis not present

## 2021-09-15 DIAGNOSIS — Z23 Encounter for immunization: Secondary | ICD-10-CM

## 2021-09-15 MED ORDER — VENLAFAXINE HCL ER 150 MG PO CP24: 150.0000 mg | ORAL_CAPSULE | Freq: Every day | ORAL | 1 refills | Status: DC

## 2021-09-16 NOTE — Progress Notes (Signed)
Checked [facility] and nothing in system

## 2021-09-16 NOTE — Progress Notes (Signed)
Called patient and she states she got flu shot at a pharmacy on [address] she is unsure if it was [facility] or [facility].  I have called both and neither have record of her getting vaccine at either Pharmacy

## 2021-11-29 DIAGNOSIS — M542 Cervicalgia: Secondary | ICD-10-CM | POA: Diagnosis not present

## 2021-12-07 ENCOUNTER — Encounter: Payer: Self-pay | Admitting: Family Medicine

## 2022-01-12 DIAGNOSIS — Z03818 Encounter for observation for suspected exposure to other biological agents ruled out: Secondary | ICD-10-CM | POA: Diagnosis not present

## 2022-01-12 DIAGNOSIS — Z20822 Contact with and (suspected) exposure to covid-19: Secondary | ICD-10-CM | POA: Diagnosis not present

## 2022-01-12 DIAGNOSIS — J209 Acute bronchitis, unspecified: Secondary | ICD-10-CM | POA: Diagnosis not present

## 2022-01-12 DIAGNOSIS — J029 Acute pharyngitis, unspecified: Secondary | ICD-10-CM | POA: Diagnosis not present

## 2022-03-15 NOTE — Progress Notes (Unsigned)
Chief Complaint  Patient presents with   Annual Exam    Nonfasting annual exam, does want to get back on OCP's and wants to do her pap here but not today (got VERY stressed out when we were discussing). Also wants to discuss excessive sweating, night sweats and anxiety. Going to [place] x 8 months -leaving Sept. Would like to get back on OCP's prior to this.    Kathryn Blanchard is a 21 y.o. female who presents for a complete physical and follow-up on anxiety.   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 She will be there through the end of May 2024. She wants to get back on OCP's prior to going to [place]. She stopped them on her own in April 2023--has only been off x 2 months. She has noticed increased acne and heavier periods, lasting longer.   OCP's had been prescribed by dermatologist. She had to stop Accutane early (after 7 months) due to high cholesterol.  Started OCP's freshman year of college.  Anxiety:  Her Effexor dose was increased from 78m to 1522min November. She noted improvement in anxiety. GAD-7 score was 3 in 08/2021. In 11/2021 she sent message reporting increase in nightmares. (She was encouraged to schedule a visit to discuss this, and potentially make changes in meds).  She has had night sweats x 2 years, worse when she has nightmares. She has woken up a few times since graduation, since home, waking up crying, bed is soaked from sweat.   She reports she has had 2 "breakdowns" since home--while getting ready for a job interview 2 days ago--face very sweaty, and her back as well. Dreams got more vivid with the dose change; doesn't think the sweating is much worse with the increase in dose.  Didn't have any of these sweating issues until sophomore year of college--when started meds, and also developed worsening anxiety.  This was mainly night sweats. She noted the sweating when she  was first put on Zoloft (1st medication prescribed).   Immunization History  Administered Date(s) Administered   DTaP 06/19/2000, 08/22/2000, 10/26/2000, 10/04/2001, 06/04/2004   HPV Quadrivalent 05/28/2012, 07/30/2012, 11/28/2012   Hepatitis A, Ped/Adol-2 Dose 05/28/2007, 07/28/2008   Hepatitis B, ped/adol 0708-27-0108/28/2001, 01/15/2001   HiB (PRP-T) 06/19/2000, 08/22/2000, 10/26/2000, 10/04/2001   IPV 06/19/2000, 08/22/2000, 10/26/2000, 06/04/2004   Influenza Nasal 05/28/2007, 07/28/2008, 07/29/2009, 08/03/2010, 09/09/2011   Influenza Split 08/03/2020   Influenza,Quad,Nasal, Live 10/28/2014   Influenza,inj,Quad PF,6+ Mos 08/17/2017   Influenza-Unspecified 05/28/2012, 08/08/2013   MMR 04/16/2001, 06/04/2004   Meningococcal B, OMV 08/17/2017, 09/18/2017   Meningococcal Conjugate 05/28/2012, 08/17/2017   PFIZER(Purple Top)SARS-COV-2 Vaccination 12/31/2019, 01/22/2020, 09/08/2020   Pfizer Covid-19 Vaccine Bivalent Booster 1243yr up 09/15/2021   Pneumococcal-Unspecified 07/17/2000, 10/16/2000, 01/15/2001   Tdap 08/03/2010, 09/07/2020   Varicella 04/16/2001, 05/04/2006   Sexual partners: 4 lifetime, not currently active Contraception: none currently. Plans to restart OCP's (for skin/periods) Alcohol: 3-4 glasses of wine/week Drugs: smokes marijuana or Delta 8 once a week Sleep: usually 8 hours Screen time: 2 hours/day Diet: generally healthy.  Fast food 2-3x/week ([employer]), less since living with parents and not at school. +cheese, yogurt, milk (a few glasses/week)  Last Pap smear: never Dentist: past due (2 years ago). Ophtho: none Exercise: dog walks 3x/week.  Normal lipid screen 08/2020 (see scanned lab results)   PMH, PSH, SH and FH were reviewed and updated  Outpatient Encounter Medications as of 03/16/2022  Medication Sig Note   busPIRone (BUSPAR) 5 MG tablet Start 1 pill twice daily; increase to 1.5 pills twice daily after a week, and then further increase by  another 1/2 tablet each week, if needed, to a max of 3 pills twice daily    [DISCONTINUED] venlafaxine XR (EFFEXOR-XR) 150 MG 24 hr capsule Take 1 capsule (150 mg total) by mouth daily with breakfast.    ALPRAZolam (XANAX) 0.25 MG tablet Take 1 tablet (0.25 mg total) by mouth 2 (two) times daily as needed for anxiety. (Patient not taking: Reported on 10/01/2020) 10/01/2020: Uses prn, rarely, for panic   desogestrel-ethinyl estradiol (APRI) 0.15-30 MG-MCG tablet Take 1 tablet by mouth daily.    hydrOXYzine (ATARAX/VISTARIL) 25 MG tablet Take 1 tablet (25 mg total) by mouth every 6 (six) hours. (Patient not taking: Reported on 10/01/2020) 10/01/2020: Uses prn anxiety   venlafaxine XR (EFFEXOR-XR) 75 MG 24 hr capsule Take 1 capsule (75 mg total) by mouth daily with breakfast.    [DISCONTINUED] APRI 0.15-30 MG-MCG tablet Take 1 tablet by mouth daily. (Patient not taking: Reported on 03/16/2022)    No facility-administered encounter medications on file as of 03/16/2022.   Taking Effexor XR 134m prior to today's visit, and was not taking OCP's. NOT taking Buspar prior to today's visit.  No Known Allergies  ROS: The patient denies anorexia, fever, weight changes, headaches,  vision changes, decreased hearing, ear pain, sore throat, breast concerns, chest pain, palpitations, syncope, dyspnea on exertion, cough, swelling, nausea, vomiting, diarrhea, constipation, abdominal pain, melena, hematochezia, indigestion/heartburn, hematuria, incontinence, dysuria, irregular menstrual cycles, vaginal discharge, odor or itch, genital lesions, joint pains, numbness, tingling, weakness, tremor, suspicious skin lesions, depression, abnormal bleeding/bruising, or enlarged lymph nodes. Anxiety, vivid dreams and sweating, per HPI. Intermittent dizziness--gets up quickly and vision is black.  Once it occurred 14x in a day.   PHYSICAL EXAM:  BP 120/70   Pulse 68   Ht 5' 5.5" (1.664 m)   Wt 152 lb 6.4 oz (69.1 kg)   LMP  03/02/2022 (Approximate)   BMI 24.97 kg/m   Wt Readings from Last 3 Encounters:  03/16/22 152 lb 6.4 oz (69.1 kg)  09/15/21 153 lb 12.8 oz (69.8 kg)  08/09/21 140 lb (63.5 kg)   General Appearance:    Alert, cooperative, no distress, appears stated age  Head:    Normocephalic, without obvious abnormality, atraumatic  Eyes:    PERRL, conjunctiva/corneas clear, EOM's intact, fundi    benign  Ears:    Normal TM's and external ear canals  Nose:   Nares normal, mucosa normal, no drainage or sinus   tenderness  Throat:   Lips, mucosa, and tongue normal; teeth and gums normal  Neck:   Supple, no lymphadenopathy;  thyroid:  no   enlargement/tenderness/nodules; no carotid   bruit or JVD  Back:    Spine nontender, no curvature, ROM normal, no CVA     tenderness  Lungs:     Clear to auscultation bilaterally without wheezes, rales or     ronchi; respirations unlabored  Chest Wall:    No tenderness or deformity   Heart:    Regular rate and rhythm, S1 and S2 normal, no murmur, rub   or gallop  Breast Exam:    Not performed today  Abdomen:     Soft, non-tender, nondistended, normoactive bowel sounds,    no masses, no hepatosplenomegaly  Genitalia:    Not performed today, per patient  request. Not mentally ready and prefers to return at a later date for pap/pelvic  Rectal:    Not performed due to age<40 and no related complaints  Extremities:   No clubbing, cyanosis or edema  Pulses:   2+ and symmetric all extremities  Skin:   Skin color, texture, turgor normal, no rashes or lesions. Some acne noted on chin.  Lymph nodes:   Cervical, supraclavicular nodes normal  Neurologic:   CNII-XII intact, normal strength, sensation and gait; reflexes 2+ and symmetric throughout          Psych:   Anxious mood, affect, hygiene and grooming.       03/16/2022    1:39 PM 09/15/2021   11:48 AM 08/09/2021    1:15 PM 01/11/2021    4:33 PM  GAD 7 : Generalized Anxiety Score  Nervous, Anxious, on Edge '2 1 1 1   ' Control/stop worrying 0 0 1 1  Worry too much - different things 1 0 1 2  Trouble relaxing '1 1 1 2  ' Restless 0 0 1 1  Easily annoyed or irritable '2 1 1 2  ' Afraid - awful might happen 0 0 0 0  Total GAD 7 Score '6 3 6 9  ' Anxiety Difficulty Very difficult Somewhat difficult Somewhat difficult      ASSESSMENT/PLAN:  Annual physical exam - Plan: Visual acuity screening, POCT Urinalysis DIP (Proadvantage Device), CBC with Differential/Platelet, VITAMIN D 25 Hydroxy (Vit-D Deficiency, Fractures), GC/Chlamydia Probe Amp, HIV Antibody (routine testing w rflx), Hepatitis C antibody, RPR  Generalized anxiety disorder - vivid dreams as SE from Effexor; night sweats/diaphoresis--unsure if related to med vs anxiety. Lower dose and add Buspar - Plan: venlafaxine XR (EFFEXOR-XR) 75 MG 24 hr capsule, busPIRone (BUSPAR) 5 MG tablet  Acne vulgaris - slight worsening since OCP's stopped.  Restart OCP's - Plan: desogestrel-ethinyl estradiol (APRI) 0.15-30 MG-MCG tablet  Menorrhagia with regular cycle - change in cycles since stopping OCP's, prefers to restart. Risks/SE of OCP's reviewed - Plan: desogestrel-ethinyl estradiol (APRI) 0.15-30 MG-MCG tablet, CBC with Differential/Platelet  Screen for STD (sexually transmitted disease) - Plan: GC/Chlamydia Probe Amp, HIV Antibody (routine testing w rflx), Hepatitis C antibody, RPR  F/u 4-5 weeks--follow-up on anxiety, and for breast/pelvic/pap smear.  Effexor may be contributing to the vivid dreams and sweating. We will start tapering this off, and add in Buspirone to help treat anxiety. Alternate 123m with 744mfor 7-10 days (if you feel really bad, you can do this a little slower, such as by doing 7522mvery 3rd day, rather than every other), then cut back further (as you tolerate) to a daily dose of 82m37mold at the 82mg55mly until the next visit. Start buspar at 5mg t55me daily for a week. Then increase to 7.5 mg twice daily (vs 5 mg three times/day)   After 1 week, if still feeling anxious, you can take 10mg (15mlls) in the morning, 1.5 pills at night, and a week later can increase to 10mg tw58mdaily. Each week, you can further increase by 1/2 tablet (2.5mg), if82meded. You can titrate up to 15mg twic28mily, according to this plan, if needed.  Discussed monthly self breast exams, at least 30 minutes of aerobic activity at least 5 days/week, weight-bearing exercise at least 2x/week; proper sunscreen use reviewed; healthy diet, including goals of calcium and vitamin D intake and alcohol recommendations (less than or equal to 1 drink/day) reviewed; regular seatbelt use; changing batteries in smoke detectors.  Discussed safe  sex, Plan B, avoiding tobacco, drugs, distracted driving, helmet use.  Immunization recommendations discussed.  Updated bivalent COVID vaccine recommended when available in the Fall. Yearly flu shots recommended.

## 2022-03-16 ENCOUNTER — Ambulatory Visit (INDEPENDENT_AMBULATORY_CARE_PROVIDER_SITE_OTHER): Payer: BC Managed Care – PPO | Admitting: Family Medicine

## 2022-03-16 ENCOUNTER — Encounter: Payer: Self-pay | Admitting: Family Medicine

## 2022-03-16 VITALS — BP 120/70 | HR 68 | Ht 65.5 in | Wt 152.4 lb

## 2022-03-16 DIAGNOSIS — F411 Generalized anxiety disorder: Secondary | ICD-10-CM

## 2022-03-16 DIAGNOSIS — L700 Acne vulgaris: Secondary | ICD-10-CM

## 2022-03-16 DIAGNOSIS — Z0000 Encounter for general adult medical examination without abnormal findings: Secondary | ICD-10-CM | POA: Diagnosis not present

## 2022-03-16 DIAGNOSIS — Z113 Encounter for screening for infections with a predominantly sexual mode of transmission: Secondary | ICD-10-CM | POA: Diagnosis not present

## 2022-03-16 DIAGNOSIS — N920 Excessive and frequent menstruation with regular cycle: Secondary | ICD-10-CM

## 2022-03-16 LAB — POCT URINALYSIS DIP (PROADVANTAGE DEVICE)
Bilirubin, UA: NEGATIVE
Blood, UA: NEGATIVE
Glucose, UA: NEGATIVE mg/dL
Ketones, POC UA: NEGATIVE mg/dL
Nitrite, UA: NEGATIVE
Protein Ur, POC: NEGATIVE mg/dL
Specific Gravity, Urine: 1.025
Urobilinogen, Ur: NEGATIVE
pH, UA: 6 (ref 5.0–8.0)

## 2022-03-16 MED ORDER — VENLAFAXINE HCL ER 75 MG PO CP24: 75.0000 mg | ORAL_CAPSULE | Freq: Every day | ORAL | 1 refills | Status: DC

## 2022-03-16 MED ORDER — BUSPIRONE HCL 5 MG PO TABS: ORAL_TABLET | ORAL | 1 refills | Status: DC

## 2022-03-16 MED ORDER — DESOGESTREL-ETHINYL ESTRADIOL 0.15-30 MG-MCG PO TABS: 1.0000 | ORAL_TABLET | Freq: Every day | ORAL | 1 refills | Status: DC

## 2022-03-16 NOTE — Patient Instructions (Addendum)
Effexor may be contributing to the vivid dreams and sweating. We will start tapering this off, and add in Buspirone to help treat anxiety. Alternate 150mg  with 75mg  for 7-10 days (if you feel really bad, you can do this a little slower, such as by doing 75mg  every 3rd day, rather than every other), then cut back further (as you tolerate) to a daily dose of 75mg . Hold at the 75mg  daily until the next visit. Start buspar at 5mg  twice daily for a week. Then increase to 7.5 mg twice daily (vs 5 mg three times/day)  After 1 week, if still feeling anxious, you can take 10mg  (2 pills) in the morning, 1.5 pills at night, and a week later can increase to 10mg  twice daily. Each week, you can further increase by 1/2 tablet (2.5mg ), if needed. You can titrate up to 15mg  twice daily, according to this plan, if needed.  Get a flu shot before leaving for (get yearly in the Fall). I recommend getting the updated bivalent COVID vaccine when it becomes available in the Fall.  Schedule a routine dental cleaning.  Your dizziness may be from dehydration or anemia. We are checking labs. Aim to have your urine be a very light yellow or clear. The darker it is, the more water (or non-caffeinated, non-alcoholic beverage) you need to drink. Excessive losses come from your sweating, the caffeine is dehydrating, and from period losses.

## 2022-03-17 LAB — CBC WITH DIFFERENTIAL/PLATELET
Basophils Absolute: 0.1 10*3/uL (ref 0.0–0.2)
Basos: 1 %
EOS (ABSOLUTE): 0.1 10*3/uL (ref 0.0–0.4)
Eos: 1 %
Hematocrit: 40.8 % (ref 34.0–46.6)
Hemoglobin: 13.7 g/dL (ref 11.1–15.9)
Immature Grans (Abs): 0 10*3/uL (ref 0.0–0.1)
Immature Granulocytes: 0 %
Lymphocytes Absolute: 2.2 10*3/uL (ref 0.7–3.1)
Lymphs: 24 %
MCH: 30.6 pg (ref 26.6–33.0)
MCHC: 33.6 g/dL (ref 31.5–35.7)
MCV: 91 fL (ref 79–97)
Monocytes Absolute: 0.9 10*3/uL (ref 0.1–0.9)
Monocytes: 9 %
Neutrophils Absolute: 6 10*3/uL (ref 1.4–7.0)
Neutrophils: 65 %
Platelets: 366 10*3/uL (ref 150–450)
RBC: 4.47 x10E6/uL (ref 3.77–5.28)
RDW: 12.8 % (ref 11.7–15.4)
WBC: 9.3 10*3/uL (ref 3.4–10.8)

## 2022-03-17 LAB — VITAMIN D 25 HYDROXY (VIT D DEFICIENCY, FRACTURES): Vit D, 25-Hydroxy: 52.3 ng/mL (ref 30.0–100.0)

## 2022-03-17 LAB — HEPATITIS C ANTIBODY: Hep C Virus Ab: NONREACTIVE

## 2022-03-17 LAB — RPR: RPR Ser Ql: NONREACTIVE

## 2022-03-17 LAB — HIV ANTIBODY (ROUTINE TESTING W REFLEX): HIV Screen 4th Generation wRfx: NONREACTIVE

## 2022-03-18 LAB — GC/CHLAMYDIA PROBE AMP
Chlamydia trachomatis, NAA: NEGATIVE
Neisseria Gonorrhoeae by PCR: NEGATIVE

## 2022-04-15 ENCOUNTER — Other Ambulatory Visit: Payer: Self-pay | Admitting: Family Medicine

## 2022-04-15 NOTE — Telephone Encounter (Signed)
Lvm for pt to call back to advise if this is needed before the appt 04/18/22 [facility]

## 2022-04-16 NOTE — Progress Notes (Unsigned)
  No chief complaint on file.   Patient presents for a 4 week follow-up on anxiety, as well as for her breast/pelvic exam/pap smear (that she had declined having done at her physical).  Anxiety: She had been complaining of vivid dreams and sweating, wondered if Effexor contributed to that. GAD-7 was 6 at her physical.  We had her taper the dose down from 150mg  to 75mg , and started Buspar. She is currently taking   ?? 10mg  BID??? Dream/sweating improved?     She is moving to in September. She was restarted on OCP's at her recent physical.  She had been off them for 2 months prior to her physical and had noticed increased acne and heavier periods, lasting longer.   She is back on OCP's ?acne and cycles improved?   PMH, PSH, SH reviewed   ROS:    PHYSICAL EXAM:  There were no vitals taken for this visit.  Wt Readings from Last 3 Encounters:  03/16/22 152 lb 6.4 oz (69.1 kg)  09/15/21 153 lb 12.8 oz (69.8 kg)  08/09/21 140 lb (63.5 kg)    Pleasant, well-appearing female in no distress HEENT: conjunctiva and sclera are clear, EOMI. Neck: no lymphadenopathy, thyromegaly or mass Heart: regular rate and rhythm Lungs: clear bilaterally Breasts: no nipple discharge inversion, skin dimpling, breast masses or axillary lymphadenopathy Abdomen: soft, nontender Pelvic: normal external genitalia without lesions.  BUS and vagina normal, no abnormal discharge  Cervix without lesions, abnormal discharge, no cervical motion tenderness.   Pap smear obtained. No uterine or adnexal masses or tenderness Extremities: no edema Neuro: alert and oriented, cranial nerves grossly intact. Strength grossly intact, normal gait. Psych:   ASSESSMENT/PLAN:  Breast/pelvic/pap smear today GAD-7  Rx effexor 37.5 for further titration RF buspar (? Dose)

## 2022-04-18 ENCOUNTER — Other Ambulatory Visit (HOSPITAL_COMMUNITY)
Admission: RE | Admit: 2022-04-18 | Discharge: 2022-04-18 | Disposition: A | Discharge status: Home / Self Care | Payer: BC Managed Care – PPO | Source: Ambulatory Visit | Attending: Family Medicine | Admitting: Family Medicine

## 2022-04-18 ENCOUNTER — Encounter: Payer: Self-pay | Admitting: Family Medicine

## 2022-04-18 ENCOUNTER — Ambulatory Visit (INDEPENDENT_AMBULATORY_CARE_PROVIDER_SITE_OTHER): Payer: BC Managed Care – PPO | Admitting: Family Medicine

## 2022-04-18 VITALS — BP 108/70 | HR 72 | Ht 65.5 in | Wt 147.0 lb

## 2022-04-18 DIAGNOSIS — Z01419 Encounter for gynecological examination (general) (routine) without abnormal findings: Secondary | ICD-10-CM | POA: Diagnosis not present

## 2022-04-18 DIAGNOSIS — F411 Generalized anxiety disorder: Secondary | ICD-10-CM | POA: Diagnosis not present

## 2022-04-18 MED ORDER — BUSPIRONE HCL 15 MG PO TABS: 15.0000 mg | ORAL_TABLET | Freq: Two times a day (BID) | ORAL | 1 refills | Status: DC

## 2022-04-18 MED ORDER — ALPRAZOLAM 0.25 MG PO TABS: 0.2500 mg | ORAL_TABLET | Freq: Two times a day (BID) | ORAL | 0 refills | Status: DC | PRN

## 2022-04-18 NOTE — Patient Instructions (Addendum)
Keep effexor at 75mg , rather than titrating the dose down (as I had originally planned, to help with sweating), since anxiety is worse currently. I'm prescribing a 15 mg tablet of buspirone. Take this in the morning, and take two of the 5mg  tablets that you have left at home each evening (15mg  in the morning, 10mg  at night). Do this for a week, then increase to 15mg  twice daily.  I agree with trying to get in with a psychiatrist to help further manage the anxiety. If you have no luck with [facility], call [facility] (on [facility]), vs others.  If you aren't having luck getting in with them right away, we can continue to increase the buspar dose (if it helps at all, and if you don't have side effects). Next step after a week (or more) at the 15mg  twice daily would be to add 5mg  mid-day, and gradually increase that to 15 mg three times daily.  Return in 4-6 weeks (unless seeing psychiatrist by then).

## 2022-04-22 LAB — CYTOLOGY - PAP: Diagnosis: NEGATIVE

## 2022-05-04 ENCOUNTER — Ambulatory Visit (INDEPENDENT_AMBULATORY_CARE_PROVIDER_SITE_OTHER): Payer: BC Managed Care – PPO | Admitting: Adult Health

## 2022-05-04 ENCOUNTER — Encounter: Payer: Self-pay | Admitting: Adult Health

## 2022-05-04 VITALS — BP 103/68 | HR 74 | Ht 65.0 in | Wt 150.0 lb

## 2022-05-04 DIAGNOSIS — F902 Attention-deficit hyperactivity disorder, combined type: Secondary | ICD-10-CM

## 2022-05-04 DIAGNOSIS — F411 Generalized anxiety disorder: Secondary | ICD-10-CM | POA: Diagnosis not present

## 2022-05-04 DIAGNOSIS — F428 Other obsessive-compulsive disorder: Secondary | ICD-10-CM | POA: Diagnosis not present

## 2022-05-04 MED ORDER — VENLAFAXINE HCL ER 37.5 MG PO CP24: 37.5000 mg | ORAL_CAPSULE | Freq: Every day | ORAL | 2 refills | Status: DC

## 2022-05-04 NOTE — Progress Notes (Signed)
[facility] MD/PA/NP Initial Note  05/04/2022 5:45 PM Kathryn Blanchard  MRN:  [id number]  Chief Complaint:   HPI:  Patient seen today for initial psychiatric evaluation.   Previously seen by Dr Marlyne Beards - last communication was August of 2021.  Describes mood today as "so-so". Pleasant. Tearful at times - "stress relief". Mood symptoms - denies depression. Feels anxious a lot of the time - "when things don't go like the should". Reports irritability - 60% of the time. Reports increased worry and rumination. Reports obsessive thoughts and acts. Gets stressed about her daily plans - "if things don't go my way, I get frustrated". Has a strict morning routine - doesn't like to leave the house until she is ready to complete her day. Has a lot of hobbies - starting new projects and then getting bored with it after a period of time. Feels like she has a "strong" motivation to do things, but then struggles to get things done. Feels like her ADD, anxiety and OCD feed into how her days go. 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 Feels like the medications she takes currently are not helping her to manage her symptoms and is willing to consider other options.Varying interest and motivation. Taking medications as prescribed.  Energy levels Active, does not have a regular exercise routine.   Enjoys some usual interests and activities. Single. Not dating. Lives with parents currently. Spending time with family. Recent graduate of [employer]. Appetite adequate.  Weight stable - 150 pounds - 65". Sleeps well most nights. Averages 8 hours. Focus and concentration difficulties. Completing tasks. Managing aspects of household. Moving to [place] in October for 8 months. Denies SI or HI.  Denies AH or VH. Denies self harm. Denies substance use.  Previous medication trials: Effexor, Buspar, Zoloft  Visit Diagnosis:    ICD-10-CM   1. Attention deficit  hyperactivity disorder (ADHD), combined type, moderate  F90.2     2. Generalized anxiety disorder  F41.1 venlafaxine XR (EFFEXOR-XR) 37.5 MG 24 hr capsule   vivid dreams as SE from Effexor; night sweats/diaphoresis--unsure if related to med vs anxiety. Lower dose and add Buspar    3. Other obsessive-compulsive disorder  F42.8       Past Psychiatric History: Denies psychiatric hospitalization.   Past Medical History:  Past Medical History:  Diagnosis Date   Acne vulgaris    ADHD (attention deficit hyperactivity disorder)    Diaphoresis    Fatigue    Generalized anxiety disorder    h/o panic attacks   Obsessive-compulsive disorder    No past surgical history on file.  Family Psychiatric History: Denies any family history of mental illness.   Family History:  Family History  Problem Relation Age of Onset   Anxiety disorder Mother    ADD / ADHD Mother    Depression Mother    Asthma Mother    OCD Father    Hypertension Father    Hypercholesterolemia Father    Alcohol abuse Neg Hx    Bipolar disorder Neg Hx    Dementia Neg Hx    Drug abuse Neg Hx    Paranoid behavior Neg Hx    Physical abuse Neg Hx    Schizophrenia Neg Hx    Seizures Neg Hx    Sexual abuse Neg Hx    Cancer Neg Hx    Diabetes Neg Hx    Heart disease Neg Hx  Social History:  Social History   Socioeconomic History   Marital status: Single    Spouse name: Not on file   Number of children: Not on file   Years of education: 13   Highest education level: Some college, no degree  Occupational History   Occupation: not employed   Occupation: Consulting civil engineer  Tobacco Use   Smoking status: Never   Smokeless tobacco: Never  Vaping Use   Vaping Use: Some days   Substances: Nicotine  Substance and Sexual Activity   Alcohol use: Yes    Alcohol/week: 3.0 standard drinks of alcohol    Types: 3 Standard drinks or equivalent per week    Comment: 3-4 glasses of wine/week   Drug use: Yes    Types: Marijuana     Comment: smokes marijuana or Delta 8 once a week, small amount   Sexual activity: Not Currently    Partners: Male  Other Topics Concern   Not on file  Social History Narrative   Graduated from [employer] 01/2022. Majored in Theatre manager.      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 She will be there through the end of May 2024.      When home, lives with parents.   Older brother lives in [place]      Updated 02/2022   Social Determinants of Health   Financial Resource Strain: Low Risk  (09/25/2018)   Overall Financial Resource Strain (CARDIA)    Difficulty of Paying Living Expenses: Not hard at all  Food Insecurity: No Food Insecurity (09/25/2018)   Hunger Vital Sign    Worried About Running Out of Food in the Last Year: Never true    Ran Out of Food in the Last Year: Never true  Transportation Needs: No Transportation Needs (09/25/2018)   PRAPARE - Administrator, Civil Service (Medical): No    Lack of Transportation (Non-Medical): No  Physical Activity: Insufficiently Active (09/25/2018)   Exercise Vital Sign    Days of Exercise per Week: 2 days    Minutes of Exercise per Session: 30 min  Stress: Stress Concern Present (09/25/2018)   [employer] of Occupational Health - Occupational Stress Questionnaire    Feeling of Stress : Rather much  Social Connections: Unknown (09/25/2018)   Social Connection and Isolation Panel [NHANES]    Frequency of Communication with Friends and Family: Patient refused    Frequency of Social Gatherings with Friends and Family: Patient refused    Attends Religious Services: Patient refused    Database administrator or Organizations: Patient refused    Attends Engineer, structural: Patient refused    Marital Status: Patient refused    Allergies: No Known Allergies  Metabolic Disorder Labs: No results found for: "HGBA1C", "MPG" No results found for:  "PROLACTIN" No results found for: "CHOL", "TRIG", "HDL", "CHOLHDL", "VLDL", "LDLCALC" No results found for: "TSH"  Therapeutic Level Labs: No results found for: "LITHIUM" No results found for: "VALPROATE" No results found for: "CBMZ"  Current Medications: Current Outpatient Medications  Medication Sig Dispense Refill   ALPRAZolam (XANAX) 0.25 MG tablet Take 1 tablet (0.25 mg total) by mouth 2 (two) times daily as needed for anxiety. 10 tablet 0   busPIRone (BUSPAR) 15 MG tablet Take 1 tablet (15 mg total) by mouth 2 (two) times daily. 60 tablet 1   desogestrel-ethinyl estradiol (APRI) 0.15-30 MG-MCG tablet Take 1 tablet by mouth  daily. 28 tablet 1   hydrOXYzine (ATARAX/VISTARIL) 25 MG tablet Take 1 tablet (25 mg total) by mouth every 6 (six) hours. (Patient not taking: Reported on 10/01/2020) 20 tablet 0   venlafaxine XR (EFFEXOR-XR) 37.5 MG 24 hr capsule Take 1 capsule (37.5 mg total) by mouth daily with breakfast. 30 capsule 2   No current facility-administered medications for this visit.    Medication Side Effects: none  Orders placed this visit:  No orders of the defined types were placed in this encounter.   Psychiatric Specialty Exam:  Review of Systems  Musculoskeletal:  Negative for gait problem.  Neurological:  Negative for tremors.  Psychiatric/Behavioral:         Please refer to HPI    Blood pressure 103/68, pulse 74, height 5\' 5"  (1.651 m), weight 150 lb (68 kg), last menstrual period 04/03/2022.Body mass index is 24.96 kg/m.  General Appearance: Casual and Neat  Eye Contact:  Good  Speech:  Clear and Coherent and Normal Rate  Volume:  Normal  Mood:  Euthymic  Affect:  Appropriate and Congruent  Thought Process:  Coherent and Descriptions of Associations: Intact  Orientation:  Full (Time, Place, and Person)  Thought Content: Logical   Suicidal Thoughts:  No  Homicidal Thoughts:  No  Memory:  WNL  Judgement:  Good  Insight:  Good  Psychomotor Activity:   Normal  Concentration:  Concentration: Good and Attention Span: Good  Recall:  Good  Fund of Knowledge: Good  Language: Good  Assets:  Communication Skills Desire for Improvement Financial Resources/Insurance Housing Intimacy Leisure Time Physical Health Resilience Social Support Talents/Skills Transportation Vocational/Educational  ADL's:  Intact  Cognition: WNL  Prognosis:  Good   Screenings:  GAD-7    Flowsheet Row Office Visit from 04/18/2022 in 04/20/2022 Family Medicine Office Visit from 03/16/2022 in 03/18/2022 Family Medicine Clinical Support from 09/15/2021 in 09/17/2021 Family Medicine Video Visit from 08/09/2021 in 08/11/2021 Family Medicine Video Visit from 01/11/2021 in 01/13/2021 Family Medicine  Total GAD-7 Score 8 6 3 6 9       PHQ2-9    Flowsheet Row Office Visit from 04/18/2022 in Family Medicine Office Visit from 03/16/2022 in [facility] Video Visit from 08/09/2021 in [facility] Video Visit from 01/11/2021 in [facility] Video Visit from 10/01/2020 in [facility]  PHQ-2 Total Score 0 0 0 0 0       Receiving Psychotherapy: No   Treatment Plan/Recommendations:   Plan:  D/C Effexor XR 75mg  daily to 37.5mg  daily - tapering off Buspar 15mg  twice daily  Initiate gene sight testing before making any further medication changes.   PDMP reviewed  Time spent with patient was 60 minutes. Greater than 50% of face to face time with patient was spent on counseling and coordination of care.    RTC 4 weeks  Patient advised to contact office with any questions, adverse effects, or acute worsening in signs and symptoms.     10/03/2020, NP

## 2022-05-09 ENCOUNTER — Telehealth: Payer: Self-pay | Admitting: Family Medicine

## 2022-05-09 NOTE — Telephone Encounter (Signed)
Pt came in and dropped off a form for her move to [place]. Pt states that she has spoken to Dr. Lynelle Doctor concerning this form. Sending back to be signed. Please call pt at [phone] once completed.

## 2022-05-11 NOTE — Telephone Encounter (Signed)
Form stamped and copied. Left vm that form is ready for pick up.

## 2022-05-11 NOTE — Telephone Encounter (Signed)
Form signed, in outgoing red folder

## 2022-05-12 ENCOUNTER — Other Ambulatory Visit: Payer: Self-pay | Admitting: Family Medicine

## 2022-05-12 DIAGNOSIS — L700 Acne vulgaris: Secondary | ICD-10-CM

## 2022-05-12 DIAGNOSIS — N920 Excessive and frequent menstruation with regular cycle: Secondary | ICD-10-CM

## 2022-05-14 ENCOUNTER — Other Ambulatory Visit: Payer: Self-pay | Admitting: Family Medicine

## 2022-05-14 DIAGNOSIS — F411 Generalized anxiety disorder: Secondary | ICD-10-CM

## 2022-05-18 ENCOUNTER — Encounter: Payer: Self-pay | Admitting: Adult Health

## 2022-05-20 ENCOUNTER — Ambulatory Visit (INDEPENDENT_AMBULATORY_CARE_PROVIDER_SITE_OTHER): Payer: BC Managed Care – PPO | Admitting: Adult Health

## 2022-05-20 ENCOUNTER — Telehealth: Payer: Self-pay | Admitting: Adult Health

## 2022-05-20 ENCOUNTER — Other Ambulatory Visit: Payer: Self-pay

## 2022-05-20 ENCOUNTER — Encounter: Payer: Self-pay | Admitting: Adult Health

## 2022-05-20 DIAGNOSIS — F902 Attention-deficit hyperactivity disorder, combined type: Secondary | ICD-10-CM

## 2022-05-20 DIAGNOSIS — F411 Generalized anxiety disorder: Secondary | ICD-10-CM | POA: Diagnosis not present

## 2022-05-20 DIAGNOSIS — F428 Other obsessive-compulsive disorder: Secondary | ICD-10-CM | POA: Diagnosis not present

## 2022-05-20 MED ORDER — AMPHETAMINE-DEXTROAMPHETAMINE 20 MG PO TABS: 20.0000 mg | ORAL_TABLET | Freq: Three times a day (TID) | ORAL | 0 refills | Status: DC

## 2022-05-20 MED ORDER — DESVENLAFAXINE SUCCINATE ER 50 MG PO TB24: ORAL_TABLET | ORAL | 1 refills | Status: DC

## 2022-05-20 NOTE — Progress Notes (Signed)
Kathryn Blanchard [id number] 2001-03-02 21 y.o.  Subjective:   Patient ID:  Kathryn Blanchard is a 21 y.o. (DOB 01/09/2001) female.  Chief Complaint: No chief complaint on file.   HPI AZIE MCCONAHY presents to the office today for follow-up of ADD, GAD, OCD.  Previously seen by Dr Marlyne Beards.  Describes mood today as "ok". Pleasant. Tearful at times - "stress relief". Mood symptoms - denies depression. Reports anxiety at times. Reports irritability - "gets frustrated". Reports worry and rumination. Reports obsessive thoughts and acts. Reports routines - "getting caught up in things". Stating "things are harder for me without a routine". Feels frustrated and overwhelmed at times - "usually over the small" things. Feels like her ADD, anxiety and OCD are limiting her. Willing to consider other options. Has reviewed Genesight testing. Varying interest and motivation. Taking medications as prescribed.  Energy levels lower. Active, does not have a regular exercise routine.   Enjoys some usual interests and activities. Single. Not dating. Lives with parents currently. Spending time with family. Recent graduate of [employer]. Appetite adequate.  Weight stable - 150 pounds - 65". Sleeps well most nights. Averages 8 hours. Focus and concentration difficulties - "focusing on one thing is hard for me". Has struggled with attention issues throughout her life. Not able to sit through movies - likes tik toks because it moves quickly. Reports day dreaming. Completing tasks. Managing aspects of household. Moving to [place] in October for 8 months. Denies SI or HI.  Denies AH or VH. Denies self harm. Denies substance use.  Previous medication trials: Effexor, Buspar, Zoloft  GAD-7    Flowsheet Row Office Visit from 04/18/2022 in [facility] Office Visit from 03/16/2022 in [facility] Clinical Support from 09/15/2021 in [facility] Video Visit from 08/09/2021 in [facility]  Video Visit from 01/11/2021 in [facility]  Total GAD-7 Score 8 6 3 6 9       PHQ2-9    Flowsheet Row Office Visit from 04/18/2022 in 04/20/2022 Family Medicine Office Visit from 03/16/2022 in 03/18/2022 Family Medicine Video Visit from 08/09/2021 in 08/11/2021 Family Medicine Video Visit from 01/11/2021 in 01/13/2021 Family Medicine Video Visit from 10/01/2020 in 10/03/2020 Family Medicine  PHQ-2 Total Score 0 0 0 0 0        Review of Systems:  Review of Systems  Musculoskeletal:  Negative for gait problem.  Neurological:  Negative for tremors.  Psychiatric/Behavioral:         Please refer to HPI    Medications: I have reviewed the patient's current medications.  Current Outpatient Medications  Medication Sig Dispense Refill   desvenlafaxine (PRISTIQ) 50 MG 24 hr tablet Take one tablet in the morning and one tablet at lunch. 180 tablet 1   ALPRAZolam (XANAX) 0.25 MG tablet Take 1 tablet (0.25 mg total) by mouth 2 (two) times daily as needed for anxiety. 10 tablet 0   APRI 0.15-30 MG-MCG tablet TAKE 1 TABLET BY MOUTH DAILY 28 tablet 1   busPIRone (BUSPAR) 15 MG tablet Take 1 tablet (15 mg total) by mouth 2 (two) times daily. 60 tablet 1   hydrOXYzine (ATARAX/VISTARIL) 25 MG tablet Take 1 tablet (25 mg total) by mouth every 6 (six) hours. (Patient not taking: Reported on 10/01/2020) 20 tablet 0   No current facility-administered medications for this visit.    Medication Side Effects: None  Allergies: No Known Allergies  Past Medical History:  Diagnosis Date   Acne vulgaris  ADHD (attention deficit hyperactivity disorder)    Diaphoresis    Fatigue    Generalized anxiety disorder    h/o panic attacks   Obsessive-compulsive disorder     Past Medical History, Surgical history, Social history, and Family history were reviewed and updated as appropriate.   Please see review of systems for further details on the patient's review from today.   Objective:   Physical Exam:   There were no vitals taken for this visit.  Physical Exam  Lab Review:  No results found for: "NA", "K", "CL", "CO2", "GLUCOSE", "BUN", "CREATININE", "CALCIUM", "PROT", "ALBUMIN", "AST", "ALT", "ALKPHOS", "BILITOT", "GFRNONAA", "GFRAA"     Component Value Date/Time   WBC 9.3 03/16/2022 1453   RBC 4.47 03/16/2022 1453   HGB 13.7 03/16/2022 1453   HCT 40.8 03/16/2022 1453   PLT 366 03/16/2022 1453   MCV 91 03/16/2022 1453   MCH 30.6 03/16/2022 1453   MCHC 33.6 03/16/2022 1453   RDW 12.8 03/16/2022 1453   LYMPHSABS 2.2 03/16/2022 1453   EOSABS 0.1 03/16/2022 1453   BASOSABS 0.1 03/16/2022 1453    No results found for: "POCLITH", "LITHIUM"   No results found for: "PHENYTOIN", "PHENOBARB", "VALPROATE", "CBMZ"   .res Assessment: Plan:    Plan:  D/C Effexor XR 37.5mg  daily - tapering off D/C Buspar 15mg  twice daily - not helpful  Discussed ADD medications - will discuss options with mother and call to advise.  Add Pristiq 50mg  in the am and lunch.  Reviewed gene sight testing before making any further medication changes.   Discussed NAC and Magnesium.  PDMP reviewed  Time spent with patient was 25 minutes. Greater than 50% of face to face time with patient was spent on counseling and coordination of care.    RTC 4 weeks  Patient advised to contact office with any questions, adverse effects, or acute worsening in signs and symptoms.  Diagnoses and all orders for this visit:  Generalized anxiety disorder -     desvenlafaxine (PRISTIQ) 50 MG 24 hr tablet; Take one tablet in the morning and one tablet at lunch.  Attention deficit hyperactivity disorder (ADHD), combined type, moderate  Other obsessive-compulsive disorder -     desvenlafaxine (PRISTIQ) 50 MG 24 hr tablet; Take one tablet in the morning and one tablet at lunch.     Please see After Visit Summary for patient specific instructions.  No future appointments.   No orders of the defined types were  placed in this encounter.   -------------------------------

## 2022-05-20 NOTE — Telephone Encounter (Signed)
Pended.

## 2022-05-20 NOTE — Telephone Encounter (Signed)
Ok to pend adderall 20mg  TID.

## 2022-05-20 NOTE — Telephone Encounter (Signed)
Please review

## 2022-05-20 NOTE — Telephone Encounter (Signed)
Kathryn Blanchard called at 10:35 to report that she would like to go with the Adderall.  Send her prescriptions to the [facility] on [address]  Her mom will pick up her medications and send them to her while she is out of the country.

## 2022-05-25 ENCOUNTER — Encounter: Payer: Self-pay | Admitting: Internal Medicine

## 2022-06-01 ENCOUNTER — Ambulatory Visit: Payer: BC Managed Care – PPO | Admitting: Adult Health

## 2022-06-09 ENCOUNTER — Telehealth: Payer: Self-pay | Admitting: Adult Health

## 2022-06-09 NOTE — Telephone Encounter (Signed)
Pt called checking on the status of her PA on prestiq. [facility] sent it a week ago

## 2022-06-09 NOTE — Telephone Encounter (Signed)
PA pending with [employer] for quantity of #60/30 day Pristiq 50 mg

## 2022-06-16 ENCOUNTER — Other Ambulatory Visit: Payer: Self-pay | Admitting: Family Medicine

## 2022-06-16 DIAGNOSIS — F411 Generalized anxiety disorder: Secondary | ICD-10-CM

## 2022-06-16 NOTE — Telephone Encounter (Signed)
Is this okay to refill? Note said to return unless she was seeing psych-looks like she has been.

## 2022-06-16 NOTE — Telephone Encounter (Signed)
Now seeing psych--[facility] should fill this for her, not [place]

## 2022-06-17 NOTE — Telephone Encounter (Signed)
Prior Authorization DENIED for DESVENLAFAXINE 50 MG ER 2/DAY insurance will cover 1/DAY

## 2022-06-20 ENCOUNTER — Other Ambulatory Visit: Payer: Self-pay

## 2022-06-20 ENCOUNTER — Other Ambulatory Visit: Payer: Self-pay | Admitting: Family Medicine

## 2022-06-20 ENCOUNTER — Telehealth: Payer: Self-pay | Admitting: Family Medicine

## 2022-06-20 DIAGNOSIS — L700 Acne vulgaris: Secondary | ICD-10-CM

## 2022-06-20 DIAGNOSIS — N920 Excessive and frequent menstruation with regular cycle: Secondary | ICD-10-CM

## 2022-06-20 DIAGNOSIS — F411 Generalized anxiety disorder: Secondary | ICD-10-CM

## 2022-06-20 DIAGNOSIS — F428 Other obsessive-compulsive disorder: Secondary | ICD-10-CM

## 2022-06-20 MED ORDER — DESOGESTREL-ETHINYL ESTRADIOL 0.15-30 MG-MCG PO TABS: 1.0000 | ORAL_TABLET | Freq: Every day | ORAL | 3 refills | Status: DC

## 2022-06-20 MED ORDER — DESVENLAFAXINE SUCCINATE ER 50 MG PO TB24: ORAL_TABLET | ORAL | 1 refills | Status: DC

## 2022-06-20 NOTE — Telephone Encounter (Signed)
Rx for 1/day submitted

## 2022-06-20 NOTE — Telephone Encounter (Signed)
Ok to send in one a day.

## 2022-06-20 NOTE — Telephone Encounter (Signed)
Pt calling in asking for a refill on her APRI for 3 months because she will be studying abroad to [place] [phone] - [employer], [place]

## 2022-06-20 NOTE — Telephone Encounter (Signed)
RF sent x 1 year (90d supply).  She is moving to [place]. Had pap in July.

## 2022-06-21 ENCOUNTER — Telehealth: Payer: Self-pay | Admitting: Adult Health

## 2022-06-21 ENCOUNTER — Other Ambulatory Visit: Payer: Self-pay

## 2022-06-21 ENCOUNTER — Encounter: Payer: Self-pay | Admitting: Family Medicine

## 2022-06-21 MED ORDER — AMPHETAMINE-DEXTROAMPHETAMINE 20 MG PO TABS
20.0000 mg | ORAL_TABLET | Freq: Three times a day (TID) | ORAL | 0 refills | Status: DC
Start: 2022-06-21 — End: 2022-08-03

## 2022-06-21 NOTE — Telephone Encounter (Signed)
Pended.

## 2022-06-21 NOTE — Telephone Encounter (Signed)
Pt requesting Rx for generic adderall 20 mg 3/d [facility] [address].  Will schedule apt when return to the country. May be gone 8 months.

## 2022-08-03 ENCOUNTER — Other Ambulatory Visit: Payer: Self-pay

## 2022-08-03 ENCOUNTER — Telehealth: Payer: Self-pay | Admitting: Adult Health

## 2022-08-03 MED ORDER — AMPHETAMINE-DEXTROAMPHETAMINE 20 MG PO TABS: 20.0000 mg | ORAL_TABLET | Freq: Three times a day (TID) | ORAL | 0 refills | Status: DC

## 2022-08-03 NOTE — Telephone Encounter (Signed)
Pended.

## 2022-08-03 NOTE — Telephone Encounter (Signed)
Mom called asking for a refill on maddie adderall 20 mg. The pharmacy is [facility] [address]

## 2022-09-09 ENCOUNTER — Other Ambulatory Visit: Payer: Self-pay

## 2022-09-09 ENCOUNTER — Telehealth: Payer: Self-pay | Admitting: Adult Health

## 2022-09-09 MED ORDER — AMPHETAMINE-DEXTROAMPHETAMINE 20 MG PO TABS: 20.0000 mg | ORAL_TABLET | Freq: Three times a day (TID) | ORAL | 0 refills | Status: DC

## 2022-09-09 NOTE — Telephone Encounter (Signed)
Pended.

## 2022-09-09 NOTE — Telephone Encounter (Signed)
PT called and said that she needs a refill on her adderall 15 mg. Marland KitchenShe was out of the country and is now back and she never got the November script because it has been held in customs. She hasn't hd any until October. She would really like to get it before the holidays. She needs an ok to refill early since it is due in 4 days. Pharmacy is [facility] [address]

## 2022-09-15 ENCOUNTER — Other Ambulatory Visit: Payer: Self-pay

## 2022-09-15 MED ORDER — AMPHETAMINE-DEXTROAMPHETAMINE 20 MG PO TABS: 20.0000 mg | ORAL_TABLET | Freq: Three times a day (TID) | ORAL | 0 refills | Status: DC

## 2022-09-15 NOTE — Telephone Encounter (Signed)
LVM rx sent on 12/22

## 2022-09-15 NOTE — Telephone Encounter (Signed)
Pt is currently in the [place] and wants to get med filled while here. She is leaving Dec 30th to go back to [place].

## 2022-09-15 NOTE — Telephone Encounter (Signed)
Ok to pend.

## 2022-09-15 NOTE — Telephone Encounter (Signed)
Pended.

## 2022-12-01 ENCOUNTER — Telehealth: Payer: Self-pay | Admitting: Adult Health

## 2022-12-01 NOTE — Telephone Encounter (Signed)
Pt's mom LVM @ 4:29p.  She is on the [facility].  She ask how [name] can get her refill since she's in [place].  No upcoming appt scheduled.

## 2022-12-02 NOTE — Telephone Encounter (Signed)
Send Rx to [facility] on [address]  Mom wants Adderall and Pristiq sent to pharmacy. A friend is going to [place] next week and will take it with her.

## 2022-12-05 ENCOUNTER — Other Ambulatory Visit: Payer: Self-pay

## 2022-12-05 DIAGNOSIS — F411 Generalized anxiety disorder: Secondary | ICD-10-CM

## 2022-12-05 DIAGNOSIS — F428 Other obsessive-compulsive disorder: Secondary | ICD-10-CM

## 2022-12-05 MED ORDER — AMPHETAMINE-DEXTROAMPHETAMINE 20 MG PO TABS: 20.0000 mg | ORAL_TABLET | Freq: Three times a day (TID) | ORAL | 0 refills | Status: DC

## 2022-12-05 MED ORDER — DESVENLAFAXINE SUCCINATE ER 50 MG PO TB24: ORAL_TABLET | ORAL | 0 refills | Status: DC

## 2022-12-05 NOTE — Telephone Encounter (Signed)
Pended.

## 2022-12-07 ENCOUNTER — Other Ambulatory Visit: Payer: Self-pay

## 2022-12-07 ENCOUNTER — Telehealth: Payer: Self-pay | Admitting: Adult Health

## 2022-12-07 MED ORDER — AMPHETAMINE-DEXTROAMPHETAMINE 20 MG PO TABS: 20.0000 mg | ORAL_TABLET | Freq: Three times a day (TID) | ORAL | 0 refills | Status: DC

## 2022-12-07 NOTE — Telephone Encounter (Signed)
Dr. Carlos Levering signed, mom notified. I did ask if she made sure they had medication in stock and she said they did.

## 2022-12-07 NOTE — Telephone Encounter (Signed)
I have sent to Dr. Carlos Levering to sign off on. Will contact mom once done.

## 2022-12-07 NOTE — Telephone Encounter (Signed)
Would you please take care of this for me.

## 2022-12-07 NOTE — Telephone Encounter (Signed)
See attached info from [facility]. Mom says this is urgent please. Kathryn Blanchard's friend is traveling to [place] to visit her and is going to take her Adderall #90 to [place] to give to her

## 2022-12-07 NOTE — Telephone Encounter (Signed)
Patient's mother called in stating that a prescription for Adderall was sent to pharmacy on Monday and she was told that she pharmacy needed a doctor's approval to be filled. Mom says patient is in [place] and urgently needs prescription filled as she has a friend leaving to visit her tomorrow and would like to send prescription along with friend. Please send prescription to [facility]: [phone]

## 2022-12-15 ENCOUNTER — Other Ambulatory Visit: Payer: Self-pay | Admitting: Adult Health

## 2022-12-15 DIAGNOSIS — F411 Generalized anxiety disorder: Secondary | ICD-10-CM

## 2022-12-15 DIAGNOSIS — F428 Other obsessive-compulsive disorder: Secondary | ICD-10-CM

## 2023-03-18 ENCOUNTER — Other Ambulatory Visit: Payer: Self-pay | Admitting: Adult Health

## 2023-03-18 DIAGNOSIS — F411 Generalized anxiety disorder: Secondary | ICD-10-CM

## 2023-03-18 DIAGNOSIS — F428 Other obsessive-compulsive disorder: Secondary | ICD-10-CM

## 2023-03-19 NOTE — Telephone Encounter (Signed)
Please call to schedule an appt.  Had been out of the country.

## 2023-03-21 ENCOUNTER — Telehealth: Payer: Self-pay | Admitting: Adult Health

## 2023-03-21 ENCOUNTER — Telehealth: Payer: Self-pay | Admitting: Family Medicine

## 2023-03-21 ENCOUNTER — Other Ambulatory Visit: Payer: Self-pay

## 2023-03-21 MED ORDER — AMPHETAMINE-DEXTROAMPHETAMINE 20 MG PO TABS: 20.0000 mg | ORAL_TABLET | Freq: Three times a day (TID) | ORAL | 0 refills | Status: DC

## 2023-03-21 NOTE — Telephone Encounter (Signed)
Pt called and is requesting a refill on her birth control Please send to the [id number] - [place], [place] - [address]

## 2023-03-21 NOTE — Telephone Encounter (Signed)
I would think the 90d refill could be transferred from [facility] to [facility]. If not, fine to rx another 90d rx, no refill

## 2023-03-21 NOTE — Telephone Encounter (Signed)
Verify dose. Note says 1 QAM and 1 at lunch.

## 2023-03-21 NOTE — Telephone Encounter (Signed)
Is this okay to call in? She scheduled CPE for next Monday.

## 2023-03-21 NOTE — Telephone Encounter (Signed)
I called to [facility] and had rx transferred as she has 90 days left at [facility]. [facility] did not have Apri in stock and has to order. I did call and leave message for patient to let her know.

## 2023-03-21 NOTE — Telephone Encounter (Signed)
Pended.

## 2023-03-21 NOTE — Telephone Encounter (Signed)
Pt scheduled her physical and says her new insurance wont allow her to pick up her apri from [facility] and needs it sent to  [id number] - [place], [place] - [address] [facility] [address]

## 2023-03-21 NOTE — Telephone Encounter (Signed)
Left message for patient to call back to clarify as she has 1 refill at [facility] and also she needs to schedule CPE as she is past due.

## 2023-03-21 NOTE — Telephone Encounter (Signed)
Kathryn Blanchard called at 1:35 to request refill of her Adderall 20mg .  Appt 7/16.  [place].  Please send prescription to [facility] #[id number] - [place], [place] - [address] AT [address] [facility] [address]

## 2023-03-22 NOTE — Telephone Encounter (Signed)
Pt is scheduled 7/16

## 2023-03-24 NOTE — Telephone Encounter (Signed)
LVM to [facility] to verify dose

## 2023-03-26 NOTE — Patient Instructions (Incomplete)
Flu shot and updated COVID booster is recommended in the Fall.  Please talk to [place] about your anxiety.  It sounds as though you are self-medicating with the Delta 8 to relax. You may need to adjust your medications  Please be sure to drink more water. This is likely contributing to your dizziness with standing . Your urine should be very pale.
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
## 2023-03-27 ENCOUNTER — Encounter: Payer: Self-pay | Admitting: Family Medicine

## 2023-03-27 ENCOUNTER — Ambulatory Visit (INDEPENDENT_AMBULATORY_CARE_PROVIDER_SITE_OTHER): Payer: Self-pay | Admitting: Family Medicine

## 2023-03-27 ENCOUNTER — Telehealth: Payer: Self-pay | Admitting: Adult Health

## 2023-03-27 VITALS — BP 100/60 | HR 64 | Ht 66.0 in | Wt 138.2 lb

## 2023-03-27 DIAGNOSIS — F902 Attention-deficit hyperactivity disorder, combined type: Secondary | ICD-10-CM | POA: Diagnosis not present

## 2023-03-27 DIAGNOSIS — F411 Generalized anxiety disorder: Secondary | ICD-10-CM

## 2023-03-27 DIAGNOSIS — N920 Excessive and frequent menstruation with regular cycle: Secondary | ICD-10-CM

## 2023-03-27 DIAGNOSIS — Z0000 Encounter for general adult medical examination without abnormal findings: Secondary | ICD-10-CM

## 2023-03-27 DIAGNOSIS — F428 Other obsessive-compulsive disorder: Secondary | ICD-10-CM

## 2023-03-27 DIAGNOSIS — Z113 Encounter for screening for infections with a predominantly sexual mode of transmission: Secondary | ICD-10-CM

## 2023-03-27 DIAGNOSIS — R42 Dizziness and giddiness: Secondary | ICD-10-CM

## 2023-03-27 DIAGNOSIS — L700 Acne vulgaris: Secondary | ICD-10-CM

## 2023-03-27 LAB — POCT URINALYSIS DIP (PROADVANTAGE DEVICE)
Bilirubin, UA: NEGATIVE
Blood, UA: NEGATIVE
Glucose, UA: NEGATIVE mg/dL
Ketones, POC UA: NEGATIVE mg/dL
Leukocytes, UA: NEGATIVE
Nitrite, UA: NEGATIVE
Protein Ur, POC: NEGATIVE mg/dL
Specific Gravity, Urine: 1.01
Urobilinogen, Ur: 0.2
pH, UA: 7.5 (ref 5.0–8.0)

## 2023-03-27 MED ORDER — DESVENLAFAXINE SUCCINATE ER 50 MG PO TB24: ORAL_TABLET | ORAL | 0 refills | Status: DC

## 2023-03-27 NOTE — Telephone Encounter (Signed)
Sent!

## 2023-03-27 NOTE — Telephone Encounter (Signed)
Patient called in for refill on Pristiq 50mg . Ph: [phone] Appt 7/16 Pharmacy [address] [place]

## 2023-03-28 ENCOUNTER — Other Ambulatory Visit: Payer: Self-pay

## 2023-03-28 ENCOUNTER — Telehealth: Payer: Self-pay | Admitting: Adult Health

## 2023-03-28 DIAGNOSIS — F428 Other obsessive-compulsive disorder: Secondary | ICD-10-CM

## 2023-03-28 DIAGNOSIS — F411 Generalized anxiety disorder: Secondary | ICD-10-CM

## 2023-03-28 LAB — CBC WITH DIFFERENTIAL/PLATELET
Basophils Absolute: 0.1 10*3/uL (ref 0.0–0.2)
Basos: 1 %
EOS (ABSOLUTE): 0.1 10*3/uL (ref 0.0–0.4)
Eos: 2 %
Hematocrit: 40.1 % (ref 34.0–46.6)
Hemoglobin: 13.2 g/dL (ref 11.1–15.9)
Immature Grans (Abs): 0 10*3/uL (ref 0.0–0.1)
Immature Granulocytes: 0 %
Lymphocytes Absolute: 3.3 10*3/uL — ABNORMAL HIGH (ref 0.7–3.1)
Lymphs: 41 %
MCH: 29.9 pg (ref 26.6–33.0)
MCHC: 32.9 g/dL (ref 31.5–35.7)
MCV: 91 fL (ref 79–97)
Monocytes Absolute: 0.7 10*3/uL (ref 0.1–0.9)
Monocytes: 8 %
Neutrophils Absolute: 3.9 10*3/uL (ref 1.4–7.0)
Neutrophils: 48 %
Platelets: 336 10*3/uL (ref 150–450)
RBC: 4.42 x10E6/uL (ref 3.77–5.28)
RDW: 13.2 % (ref 11.7–15.4)
WBC: 8.1 10*3/uL (ref 3.4–10.8)

## 2023-03-28 LAB — RPR: RPR Ser Ql: NONREACTIVE

## 2023-03-28 LAB — HIV ANTIBODY (ROUTINE TESTING W REFLEX): HIV Screen 4th Generation wRfx: NONREACTIVE

## 2023-03-28 MED ORDER — DESVENLAFAXINE SUCCINATE ER 50 MG PO TB24
ORAL_TABLET | ORAL | 0 refills | Status: DC
Start: 2023-03-28 — End: 2023-04-25

## 2023-03-28 MED ORDER — AMPHETAMINE-DEXTROAMPHETAMINE 20 MG PO TABS: 20.0000 mg | ORAL_TABLET | Freq: Three times a day (TID) | ORAL | 0 refills | Status: DC

## 2023-03-28 NOTE — Telephone Encounter (Signed)
Next visit is 04/04/23. Kathryn Blanchard had her Pristiq 50 mg called in but it wasn't in stock. She would like her Pristiq and Adderall 20 mg [facility] [address] [place], [place], [place]. Phone number is [phone]. Both of these medications are in stock at this location.   Please use the above address as her new pharmacy and delete the other ones in chart.

## 2023-03-28 NOTE — Telephone Encounter (Signed)
Called pharmacy to cancel scripts. They did have the Pristiq in stock and had filled it. Sent Pristiq to the requested pharmacy and pended Adderall.

## 2023-03-29 LAB — GC/CHLAMYDIA PROBE AMP
Chlamydia trachomatis, NAA: NEGATIVE
Neisseria Gonorrhoeae by PCR: NEGATIVE

## 2023-04-04 ENCOUNTER — Ambulatory Visit: Payer: BC Managed Care – PPO | Admitting: Adult Health

## 2023-04-25 ENCOUNTER — Ambulatory Visit: Payer: Commercial Managed Care - HMO | Admitting: Adult Health

## 2023-04-25 ENCOUNTER — Encounter: Payer: Self-pay | Admitting: Adult Health

## 2023-04-25 DIAGNOSIS — F902 Attention-deficit hyperactivity disorder, combined type: Secondary | ICD-10-CM

## 2023-04-25 DIAGNOSIS — F428 Other obsessive-compulsive disorder: Secondary | ICD-10-CM

## 2023-04-25 DIAGNOSIS — F411 Generalized anxiety disorder: Secondary | ICD-10-CM | POA: Diagnosis not present

## 2023-04-25 MED ORDER — AMPHETAMINE-DEXTROAMPHETAMINE 30 MG PO TABS: 30.0000 mg | ORAL_TABLET | Freq: Two times a day (BID) | ORAL | 0 refills | Status: DC

## 2023-04-25 MED ORDER — DESVENLAFAXINE SUCCINATE ER 100 MG PO TB24: ORAL_TABLET | ORAL | 5 refills | Status: DC

## 2023-04-25 NOTE — Progress Notes (Signed)
Kathryn Blanchard [id number] 2000-10-03 22 y.o.  Subjective:   Patient ID:  Kathryn Blanchard is a 22 y.o. (DOB 05/10/2001) female.  Chief Complaint: No chief complaint on file.   HPI Kathryn Blanchard presents to the office today for follow-up of ADD, GAD, OCD.  Previously seen by Dr Marlyne Beards.  Describes mood today as "ok". Pleasant. Denies tearfulness. Mood symptoms - denies depression and irritability. Reports anxiety at times. Denies panic attacks.Reports some worry, rumination and over thinking. Reports decreased obsessive thoughts and acts. Reports healthy routines. Mood is consistent - "better with a routine". Stating "I feel like I'm doing better". Would like to increase dose of Pristiq from 50mg  to 100mg . Feels like Adderall continues to work well for her. Improved interest and motivation. Taking medications as prescribed.  Energy levels good. Active, has a regular exercise routine.   Enjoys some usual interests and activities. Single. Not dating. Lives with parents currently. Spending time with family. Has returned from [place] and is looking for a job. Appetite adequate.  Weight loss - 143 from 150 pounds - 65". Sleeps well most nights. Averages 8 hours. Focus and concentration improved. Completing tasks. Managing aspects of household. Looking for employment. Denies SI or HI.  Denies AH or VH. Denies self harm. Denies substance use.  Previous medication trials: Effexor, Buspar, Zoloft   GAD-7    Flowsheet Row Office Visit from 04/18/2022 in [facility] Office Visit from 03/16/2022 in [facility] Clinical Support from 09/15/2021 in [facility] Video Visit from 08/09/2021 in [facility] Video Visit from 01/11/2021 in [facility]  Total GAD-7 Score 8 6 3 6 9       PHQ2-9    Flowsheet Row Office Visit from 03/27/2023 in [facility] Office Visit from 04/18/2022 in [facility] Office Visit from 03/16/2022 in  [facility] Video Visit from 08/09/2021 in [facility] Video Visit from 01/11/2021 in [facility]  PHQ-2 Total Score 0 0 0 0 0        Review of Systems:  Review of Systems  Musculoskeletal:  Negative for gait problem.  Neurological:  Negative for tremors.  Psychiatric/Behavioral:         Please refer to HPI    Medications: I have reviewed the patient's current medications.  Current Outpatient Medications  Medication Sig Dispense Refill   ALPRAZolam (XANAX) 0.25 MG tablet Take 1 tablet (0.25 mg total) by mouth 2 (two) times daily as needed for anxiety. (Patient not taking: Reported on 03/27/2023) 10 tablet 0   amphetamine-dextroamphetamine (ADDERALL) 20 MG tablet Take 1 tablet (20 mg total) by mouth 3 (three) times daily. 90 tablet 0   desogestrel-ethinyl estradiol (APRI) 0.15-30 MG-MCG tablet Take 1 tablet by mouth daily. 84 tablet 3   desvenlafaxine (PRISTIQ) 50 MG 24 hr tablet TAKE ONE TABLET BY MOUTH EVERY MORNING 90 tablet 0   No current facility-administered medications for this visit.    Medication Side Effects: None  Allergies: No Known Allergies  Past Medical History:  Diagnosis Date   Acne vulgaris    ADHD (attention deficit hyperactivity disorder)    Diaphoresis    Fatigue    Generalized anxiety disorder    h/o panic attacks   Obsessive-compulsive disorder     Past Medical History, Surgical history, Social history, and Family history were reviewed and updated as appropriate.   Please see review of systems for further details on the patient's review from today.  Objective:   Physical Exam:  LMP 03/19/2023 (Exact Date)   Physical Exam Constitutional:      General: She is not in acute distress. Musculoskeletal:        General: No deformity.  Neurological:     Mental Status: She is alert and oriented to person, place, and time.     Coordination: Coordination normal.  Psychiatric:        Attention and Perception:  Attention and perception normal. She does not perceive auditory or visual hallucinations.        Mood and Affect: Mood normal. Mood is not anxious or depressed. Affect is not labile, blunt, angry or inappropriate.        Speech: Speech normal.        Behavior: Behavior normal.        Thought Content: Thought content normal. Thought content is not paranoid or delusional. Thought content does not include homicidal or suicidal ideation. Thought content does not include homicidal or suicidal plan.        Cognition and Memory: Cognition and memory normal.        Judgment: Judgment normal.     Comments: Insight intact     Lab Review:  No results found for: "NA", "K", "CL", "CO2", "GLUCOSE", "BUN", "CREATININE", "CALCIUM", "PROT", "ALBUMIN", "AST", "ALT", "ALKPHOS", "BILITOT", "GFRNONAA", "GFRAA"     Component Value Date/Time   WBC 8.1 03/27/2023 1442   RBC 4.42 03/27/2023 1442   HGB 13.2 03/27/2023 1442   HCT 40.1 03/27/2023 1442   PLT 336 03/27/2023 1442   MCV 91 03/27/2023 1442   MCH 29.9 03/27/2023 1442   MCHC 32.9 03/27/2023 1442   RDW 13.2 03/27/2023 1442   LYMPHSABS 3.3 (H) 03/27/2023 1442   EOSABS 0.1 03/27/2023 1442   BASOSABS 0.1 03/27/2023 1442    No results found for: "POCLITH", "LITHIUM"   No results found for: "PHENYTOIN", "PHENOBARB", "VALPROATE", "CBMZ"   .res Assessment: Plan:    Plan:  Increase Pristiq 50mg  to 100mg  daily   Increase Adderall 20mg  TID to Adderall 30mg  BID  Gene sight testing on file  PDMP reviewed  Time spent with patient was 23 minutes. Greater than 50% of face to face time with patient was spent on counseling and coordination of care.    RTC 4 weeks  Patient advised to contact office with any questions, adverse effects, or acute worsening in signs and symptoms.  There are no diagnoses linked to this encounter.   Please see After Visit Summary for patient specific instructions.  Future Appointments  Date Time Provider Department  Center  04/25/2023 11:20 AM Treylan Mcclintock, Thereasa Solo, NP CP-CP None    No orders of the defined types were placed in this encounter.   -------------------------------

## 2023-05-23 ENCOUNTER — Other Ambulatory Visit: Payer: Self-pay | Admitting: Adult Health

## 2023-05-23 ENCOUNTER — Ambulatory Visit: Payer: Commercial Managed Care - HMO | Admitting: Adult Health

## 2023-05-23 DIAGNOSIS — F428 Other obsessive-compulsive disorder: Secondary | ICD-10-CM

## 2023-05-23 DIAGNOSIS — F411 Generalized anxiety disorder: Secondary | ICD-10-CM

## 2023-05-31 ENCOUNTER — Other Ambulatory Visit: Payer: Self-pay

## 2023-05-31 ENCOUNTER — Telehealth: Payer: Self-pay | Admitting: Adult Health

## 2023-05-31 DIAGNOSIS — F902 Attention-deficit hyperactivity disorder, combined type: Secondary | ICD-10-CM

## 2023-05-31 MED ORDER — AMPHETAMINE-DEXTROAMPHETAMINE 30 MG PO TABS
30.0000 mg | ORAL_TABLET | Freq: Two times a day (BID) | ORAL | 0 refills | Status: DC
Start: 2023-05-31 — End: 2023-08-10

## 2023-05-31 NOTE — Telephone Encounter (Signed)
[name] called at 9:45 to request refill of her Adderall.  Appt 9/20.  [id number] [place] [place] - [address] AT [address] [facility] [address]

## 2023-05-31 NOTE — Telephone Encounter (Signed)
Pended.

## 2023-06-09 ENCOUNTER — Encounter: Payer: Self-pay | Admitting: Adult Health

## 2023-06-09 ENCOUNTER — Telehealth: Payer: Commercial Managed Care - HMO | Admitting: Adult Health

## 2023-06-09 DIAGNOSIS — F909 Attention-deficit hyperactivity disorder, unspecified type: Secondary | ICD-10-CM | POA: Diagnosis not present

## 2023-06-09 DIAGNOSIS — F428 Other obsessive-compulsive disorder: Secondary | ICD-10-CM

## 2023-06-09 DIAGNOSIS — F902 Attention-deficit hyperactivity disorder, combined type: Secondary | ICD-10-CM

## 2023-06-09 DIAGNOSIS — F411 Generalized anxiety disorder: Secondary | ICD-10-CM | POA: Diagnosis not present

## 2023-06-09 MED ORDER — AMPHETAMINE-DEXTROAMPHET ER 30 MG PO CP24
30.0000 mg | ORAL_CAPSULE | Freq: Every day | ORAL | 0 refills | Status: DC
Start: 2023-06-09 — End: 2023-08-01

## 2023-06-09 MED ORDER — DESVENLAFAXINE SUCCINATE ER 100 MG PO TB24
ORAL_TABLET | ORAL | 1 refills | Status: DC
Start: 2023-06-09 — End: 2023-11-17

## 2023-06-09 NOTE — Progress Notes (Signed)
Kathryn Blanchard [id number] 01-13-2001 22 y.o.  Virtual Visit via Video Note  I connected with pt @ on 06/09/23 at  9:40 AM EDT by a video enabled telemedicine application and verified that I am speaking with the correct person using two identifiers.   I discussed the limitations of evaluation and management by telemedicine and the availability of in person appointments. The patient expressed understanding and agreed to proceed.  I discussed the assessment and treatment plan with the patient. The patient was provided an opportunity to ask questions and all were answered. The patient agreed with the plan and demonstrated an understanding of the instructions.   The patient was advised to call back or seek an in-person evaluation if the symptoms worsen or if the condition fails to improve as anticipated.  I provided 15 minutes of non-face-to-face time during this encounter.  The patient was located at home.  The provider was located at [facility].   Dorothyann Gibbs, NP   Subjective:   Patient ID:  Kathryn Blanchard is a 22 y.o. (DOB 2001-04-14) female.  Chief Complaint: No chief complaint on file.   HPI Kathryn Blanchard presents for follow-up of ADD, GAD, OCD.  Previously seen by Dr Marlyne Beards.  Describes mood today as "ok". Pleasant. Denies tearfulness. Mood symptoms - denies depression and irritability. Reports some anxiety and stress - "manageable". Denies panic attacks.Reports some worry, rumination and over thinking. Denies obsessive thoughts and acts. Feels productive and motivated. Mood is consistent. Stating "I feel like I'm doing good". Feels like current medication regimen is working well for her. Improved interest and motivation. Taking medications as prescribed.  Energy levels good. Active, has a regular exercise routine.   Enjoys some usual interests and activities. Single. Not dating. Lives with parents currently. Spending time with family. Starting a new job in [place] - will  be living with a roommate. Appetite adequate. Weight loss - 143 pounds - 65". Sleeps well most nights. Averages 8 hours. Focus and concentration improved. Completing tasks. Managing aspects of household. Looking for employment. Denies SI or HI.  Denies AH or VH. Denies self harm. Denies substance use.  Previous medication trials: Effexor, Buspar, Zoloft   Review of Systems:  Review of Systems  Musculoskeletal:  Negative for gait problem.  Neurological:  Negative for tremors.  Psychiatric/Behavioral:         Please refer to HPI    Medications: I have reviewed the patient's current medications.  Current Outpatient Medications  Medication Sig Dispense Refill   amphetamine-dextroamphetamine (ADDERALL) 30 MG tablet Take 1 tablet by mouth 2 (two) times daily. 60 tablet 0   desogestrel-ethinyl estradiol (APRI) 0.15-30 MG-MCG tablet Take 1 tablet by mouth daily. 84 tablet 3   desvenlafaxine (PRISTIQ) 100 MG 24 hr tablet TAKE 1 TABLET BY MOUTH EVERY DAY IN THE MORNING 90 tablet 0   No current facility-administered medications for this visit.    Medication Side Effects: None  Allergies: No Known Allergies  Past Medical History:  Diagnosis Date   Acne vulgaris    ADHD (attention deficit hyperactivity disorder)    Diaphoresis    Fatigue    Generalized anxiety disorder    h/o panic attacks   Obsessive-compulsive disorder     Family History  Problem Relation Age of Onset   Anxiety disorder Mother    ADD / ADHD Mother    Depression Mother    Asthma Mother    OCD Father    Hypertension Father  Hypercholesterolemia Father    Anxiety disorder Father    Alcohol abuse Neg Hx    Bipolar disorder Neg Hx    Dementia Neg Hx    Drug abuse Neg Hx    Paranoid behavior Neg Hx    Physical abuse Neg Hx    Schizophrenia Neg Hx    Seizures Neg Hx    Sexual abuse Neg Hx    Cancer Neg Hx    Diabetes Neg Hx    Heart disease Neg Hx     Social History   Socioeconomic History    Marital status: Single    Spouse name: Not on file   Number of children: Not on file   Years of education: 13   Highest education level: Some college, no degree  Occupational History   Occupation: not employed   Occupation: Consulting civil engineer  Tobacco Use   Smoking status: Never   Smokeless tobacco: Never  Vaping Use   Vaping status: Some Days   Substances: Synthetic cannabinoids  Substance and Sexual Activity   Alcohol use: Yes    Alcohol/week: 3.0 standard drinks of alcohol    Types: 3 Standard drinks or equivalent per week    Comment: 3-4 glasses of wine/week   Drug use: Yes    Comment: Delta 8 vape, at the end of the day 4-5 days/week   Sexual activity: Not Currently    Partners: Male    Birth control/protection: Pill    Comment: condoms (with new relationships)  Other Topics Concern   Not on file  Social History Narrative   Graduated from [employer] 01/2022. Majored in Theatre manager.      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      When home, lives with parents.   Older brother lives in [place]      Updated 03/2023   Social Determinants of Health   Financial Resource Strain: Patient Declined (03/27/2023)   Overall Financial Resource Strain (CARDIA)    Difficulty of Paying Living Expenses: Patient declined  Food Insecurity: No Food Insecurity (03/27/2023)   Hunger Vital Sign    Worried About Running Out of Food in the Last Year: Never true    Ran Out of Food in the Last Year: Never true  Transportation Needs: No Transportation Needs (03/27/2023)   PRAPARE - Administrator, Civil Service (Medical): No    Lack of Transportation (Non-Medical): No  Physical Activity: Sufficiently Active (03/27/2023)   Exercise Vital Sign    Days of Exercise per Week: 4 days    Minutes of Exercise per Session: 40 min  Stress: Patient Declined (03/27/2023)   [employer] of Occupational Health - Occupational Stress  Questionnaire    Feeling of Stress : Patient declined  Social Connections: Moderately Isolated (03/27/2023)   Social Connection and Isolation Panel [NHANES]    Frequency of Communication with Friends and Family: More than three times a week    Frequency of Social Gatherings with Friends and Family: Three times a week    Attends Religious Services: 1 to 4 times per year    Active Member of Clubs or Organizations: No    Attends Banker Meetings: Never    Marital Status: Never married  Intimate Partner Violence: Not At Risk (03/27/2023)   Humiliation, Afraid, Rape, and Kick questionnaire    Fear of Current or Ex-Partner: No    Emotionally Abused: No    Physically Abused: No  Sexually Abused: No    Past Medical History, Surgical history, Social history, and Family history were reviewed and updated as appropriate.   Please see review of systems for further details on the patient's review from today.   Objective:   Physical Exam:  There were no vitals taken for this visit.  Physical Exam Constitutional:      General: She is not in acute distress. Musculoskeletal:        General: No deformity.  Neurological:     Mental Status: She is alert and oriented to person, place, and time.     Coordination: Coordination normal.  Psychiatric:        Attention and Perception: Attention and perception normal. She does not perceive auditory or visual hallucinations.        Mood and Affect: Affect is not labile, blunt, angry or inappropriate.        Speech: Speech normal.        Behavior: Behavior normal.        Thought Content: Thought content normal. Thought content is not paranoid or delusional. Thought content does not include homicidal or suicidal ideation. Thought content does not include homicidal or suicidal plan.        Cognition and Memory: Cognition and memory normal.        Judgment: Judgment normal.     Comments: Insight intact     Lab Review:  No results found for:  "NA", "K", "CL", "CO2", "GLUCOSE", "BUN", "CREATININE", "CALCIUM", "PROT", "ALBUMIN", "AST", "ALT", "ALKPHOS", "BILITOT", "GFRNONAA", "GFRAA"     Component Value Date/Time   WBC 8.1 03/27/2023 1442   RBC 4.42 03/27/2023 1442   HGB 13.2 03/27/2023 1442   HCT 40.1 03/27/2023 1442   PLT 336 03/27/2023 1442   MCV 91 03/27/2023 1442   MCH 29.9 03/27/2023 1442   MCHC 32.9 03/27/2023 1442   RDW 13.2 03/27/2023 1442   LYMPHSABS 3.3 (H) 03/27/2023 1442   EOSABS 0.1 03/27/2023 1442   BASOSABS 0.1 03/27/2023 1442    No results found for: "POCLITH", "LITHIUM"   No results found for: "PHENYTOIN", "PHENOBARB", "VALPROATE", "CBMZ"   .res Assessment: Plan:    Plan:  Pristiq 100mg  daily  D/C Adderall 30mg  BID Add Adderall 30mg  XR daily  Will call back in 3 to 4 weeks with an update on XR - we can switch back to the IR if needed.  Gene sight testing on file  PDMP reviewed  Time spent with patient was 25 minutes. Greater than 50% of face to face time with patient was spent on counseling and coordination of care.    RTC 3 months  Patient advised to contact office with any questions, adverse effects, or acute worsening in signs and symptoms.  There are no diagnoses linked to this encounter.   Please see After Visit Summary for patient specific instructions.  Future Appointments  Date Time Provider Department Center  06/09/2023  9:40 AM Shaquille Janes, Thereasa Solo, NP CP-CP None    No orders of the defined types were placed in this encounter.     -------------------------------

## 2023-06-11 ENCOUNTER — Other Ambulatory Visit: Payer: Self-pay | Admitting: Family Medicine

## 2023-06-11 DIAGNOSIS — L700 Acne vulgaris: Secondary | ICD-10-CM

## 2023-06-11 DIAGNOSIS — N920 Excessive and frequent menstruation with regular cycle: Secondary | ICD-10-CM

## 2023-06-30 ENCOUNTER — Telehealth: Payer: Self-pay | Admitting: Adult Health

## 2023-06-30 NOTE — Telephone Encounter (Signed)
Confirmed that pharmacy had a RF and that medication was in stock. LVM for patient.

## 2023-06-30 NOTE — Telephone Encounter (Signed)
Patient called requesting a refill on the Adderall. [id number] [place], [place] - [address] AT [facility] [address] [place] [address] Phone: [phone]  Fax: [phone]   Last seen 06/09/23, no follow up scheduled

## 2023-08-01 ENCOUNTER — Telehealth: Payer: Self-pay | Admitting: Adult Health

## 2023-08-01 ENCOUNTER — Other Ambulatory Visit: Payer: Self-pay

## 2023-08-01 DIAGNOSIS — F902 Attention-deficit hyperactivity disorder, combined type: Secondary | ICD-10-CM

## 2023-08-01 MED ORDER — AMPHETAMINE-DEXTROAMPHET ER 30 MG PO CP24
30.0000 mg | ORAL_CAPSULE | Freq: Every day | ORAL | 0 refills | Status: DC
Start: 2023-08-01 — End: 2023-08-10

## 2023-08-01 NOTE — Telephone Encounter (Signed)
Please schedule pt an appt

## 2023-08-01 NOTE — Telephone Encounter (Signed)
Pt made an appt for  08/10/23

## 2023-08-01 NOTE — Telephone Encounter (Signed)
Next visit is 06/09/23. Requesting a refill on Adderall XR 30 mg called to:  [facility], [address], [place], [place] [address]. Phone number is [phone].  It is in stock.

## 2023-08-10 ENCOUNTER — Telehealth (INDEPENDENT_AMBULATORY_CARE_PROVIDER_SITE_OTHER): Payer: 59 | Admitting: Adult Health

## 2023-08-10 ENCOUNTER — Encounter: Payer: Self-pay | Admitting: Adult Health

## 2023-08-10 DIAGNOSIS — F428 Other obsessive-compulsive disorder: Secondary | ICD-10-CM

## 2023-08-10 DIAGNOSIS — F411 Generalized anxiety disorder: Secondary | ICD-10-CM

## 2023-08-10 DIAGNOSIS — F909 Attention-deficit hyperactivity disorder, unspecified type: Secondary | ICD-10-CM | POA: Diagnosis not present

## 2023-08-10 DIAGNOSIS — F902 Attention-deficit hyperactivity disorder, combined type: Secondary | ICD-10-CM

## 2023-08-10 MED ORDER — AMPHETAMINE-DEXTROAMPHETAMINE 30 MG PO TABS
30.0000 mg | ORAL_TABLET | Freq: Two times a day (BID) | ORAL | 0 refills | Status: DC
Start: 2023-08-10 — End: 2023-09-18

## 2023-08-10 NOTE — Progress Notes (Signed)
Kathryn Blanchard [id number] 03-May-2001 22 y.o.  Virtual Visit via Video Note  I connected with pt @ on 08/10/23 at  5:20 PM EST by a video enabled telemedicine application and verified that I am speaking with the correct person using two identifiers.   I discussed the limitations of evaluation and management by telemedicine and the availability of in person appointments. The patient expressed understanding and agreed to proceed.  I discussed the assessment and treatment plan with the patient. The patient was provided an opportunity to ask questions and all were answered. The patient agreed with the plan and demonstrated an understanding of the instructions.   The patient was advised to call back or seek an in-person evaluation if the symptoms worsen or if the condition fails to improve as anticipated.  I provided 15 minutes of non-face-to-face time during this encounter.  The patient was located at home.  The provider was located at [facility].   Dorothyann Gibbs, NP   Subjective:   Patient ID:  Kathryn Blanchard is a 22 y.o. (DOB [birth year]) female.  Chief Complaint: No chief complaint on file.   HPI Kathryn Blanchard presents for follow-up of  ADD, GAD, OCD.  Previously seen by Dr Marlyne Beards.  Describes mood today as "ok". Pleasant. Denies tearfulness. Mood symptoms - denies depression and irritability. Reports some anxiety related to work. Denies panic attacks. Denies worry, rumination and over thinking. Denies obsessive thoughts and acts. Mood is consistent. Stating "I feel like I'm doing ok". Feels like current medication is helpful but would like to switch from Adderall XR back to th IR formula.  Improved interest and motivation. Taking medications as prescribed.  Energy levels good. Active, has a regular exercise routine.   Enjoys some usual interests and activities. Single. Not dating. Lives in [place] with a roommate. Spending time with family.  Appetite adequate. Weight loss -  143 pounds - 65". Sleeps well most nights. Averages 9 hours. Focus and concentration improved. Completing tasks. Managing aspects of household. 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  Denies SI or HI.  Denies AH or VH. Denies self harm. Denies substance use.  Previous medication trials: Effexor, Buspar, Zoloft   Review of Systems:  Review of Systems  Musculoskeletal:  Negative for gait problem.  Neurological:  Negative for tremors.  Psychiatric/Behavioral:         Please refer to HPI    Medications: I have reviewed the patient's current medications.  Current Outpatient Medications  Medication Sig Dispense Refill   amphetamine-dextroamphetamine (ADDERALL XR) 30 MG 24 hr capsule Take 1 capsule (30 mg total) by mouth daily. 30 capsule 0   amphetamine-dextroamphetamine (ADDERALL) 30 MG tablet Take 1 tablet by mouth 2 (two) times daily. 60 tablet 0   desogestrel-ethinyl estradiol (APRI) 0.15-30 MG-MCG tablet TAKE 1 TABLET BY MOUTH EVERY DAY 84 tablet 2   desvenlafaxine (PRISTIQ) 100 MG 24 hr tablet TAKE 1 TABLET BY MOUTH EVERY DAY IN THE MORNING 90 tablet 1   No current facility-administered medications for this visit.    Medication Side Effects: None  Allergies: No Known Allergies  Past Medical History:  Diagnosis Date   Acne vulgaris    ADHD (attention deficit hyperactivity disorder)    Diaphoresis    Fatigue    Generalized anxiety disorder    h/o panic attacks   Obsessive-compulsive disorder     Family History  Problem Relation Age of Onset   Anxiety disorder Mother    ADD /  ADHD Mother    Depression Mother    Asthma Mother    OCD Father    Hypertension Father    Hypercholesterolemia Father    Anxiety disorder Father    Alcohol abuse Neg Hx    Bipolar disorder Neg Hx    Dementia Neg Hx    Drug abuse Neg Hx    Paranoid behavior Neg Hx    Physical abuse Neg Hx    Schizophrenia Neg Hx    Seizures Neg Hx    Sexual abuse Neg Hx    Cancer Neg  Hx    Diabetes Neg Hx    Heart disease Neg Hx     Social History   Socioeconomic History   Marital status: Single    Spouse name: Not on file   Number of children: Not on file   Years of education: 13   Highest education level: Some college, no degree  Occupational History   Occupation: not employed   Occupation: Consulting civil engineer  Tobacco Use   Smoking status: Never   Smokeless tobacco: Never  Vaping Use   Vaping status: Some Days   Substances: Synthetic cannabinoids  Substance and Sexual Activity   Alcohol use: Yes    Alcohol/week: 3.0 standard drinks of alcohol    Types: 3 Standard drinks or equivalent per week    Comment: 3-4 glasses of wine/week   Drug use: Yes    Comment: Delta 8 vape, at the end of the day 4-5 days/week   Sexual activity: Not Currently    Partners: Male    Birth control/protection: Pill    Comment: condoms (with new relationships)  Other Topics Concern   Not on file  Social History Narrative   Graduated from [employer] 01/2022. Majored in Theatre manager.      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      When home, lives with parents.   Older brother lives in [place]      Updated 03/2023   Social Determinants of Health   Financial Resource Strain: Patient Declined (03/27/2023)   Overall Financial Resource Strain (CARDIA)    Difficulty of Paying Living Expenses: Patient declined  Food Insecurity: No Food Insecurity (03/27/2023)   Hunger Vital Sign    Worried About Running Out of Food in the Last Year: Never true    Ran Out of Food in the Last Year: Never true  Transportation Needs: No Transportation Needs (03/27/2023)   PRAPARE - Administrator, Civil Service (Medical): No    Lack of Transportation (Non-Medical): No  Physical Activity: Sufficiently Active (03/27/2023)   Exercise Vital Sign    Days of Exercise per Week: 4 days    Minutes of Exercise per Session: 40 min   Stress: Patient Declined (03/27/2023)   [employer] of Occupational Health - Occupational Stress Questionnaire    Feeling of Stress : Patient declined  Social Connections: Moderately Isolated (03/27/2023)   Social Connection and Isolation Panel [NHANES]    Frequency of Communication with Friends and Family: More than three times a week    Frequency of Social Gatherings with Friends and Family: Three times a week    Attends Religious Services: 1 to 4 times per year    Active Member of Clubs or Organizations: No    Attends Banker Meetings: Never    Marital Status: Never married  Intimate Partner Violence: Not At Risk (03/27/2023)   Humiliation, Afraid,  Rape, and Kick questionnaire    Fear of Current or Ex-Partner: No    Emotionally Abused: No    Physically Abused: No    Sexually Abused: No    Past Medical History, Surgical history, Social history, and Family history were reviewed and updated as appropriate.   Please see review of systems for further details on the patient's review from today.   Objective:   Physical Exam:  There were no vitals taken for this visit.  Physical Exam Constitutional:      General: She is not in acute distress. Musculoskeletal:        General: No deformity.  Neurological:     Mental Status: She is alert and oriented to person, place, and time.     Coordination: Coordination normal.  Psychiatric:        Attention and Perception: Attention and perception normal. She does not perceive auditory or visual hallucinations.        Mood and Affect: Affect is not labile, blunt, angry or inappropriate.        Speech: Speech normal.        Behavior: Behavior normal.        Thought Content: Thought content normal. Thought content is not paranoid or delusional. Thought content does not include homicidal or suicidal ideation. Thought content does not include homicidal or suicidal plan.        Cognition and Memory: Cognition and memory normal.         Judgment: Judgment normal.     Comments: Insight intact     Lab Review:  No results found for: "NA", "K", "CL", "CO2", "GLUCOSE", "BUN", "CREATININE", "CALCIUM", "PROT", "ALBUMIN", "AST", "ALT", "ALKPHOS", "BILITOT", "GFRNONAA", "GFRAA"     Component Value Date/Time   WBC 8.1 03/27/2023 1442   RBC 4.42 03/27/2023 1442   HGB 13.2 03/27/2023 1442   HCT 40.1 03/27/2023 1442   PLT 336 03/27/2023 1442   MCV 91 03/27/2023 1442   MCH 29.9 03/27/2023 1442   MCHC 32.9 03/27/2023 1442   RDW 13.2 03/27/2023 1442   LYMPHSABS 3.3 (H) 03/27/2023 1442   EOSABS 0.1 03/27/2023 1442   BASOSABS 0.1 03/27/2023 1442    No results found for: "POCLITH", "LITHIUM"   No results found for: "PHENYTOIN", "PHENOBARB", "VALPROATE", "CBMZ"   .res Assessment: Plan:    Plan:  Pristiq 100mg  daily  Adderall 30mg  twice daily - d/c XR formula.  Gene sight testing on file  PDMP reviewed  Time spent with patient was 25 minutes. Greater than 50% of face to face time with patient was spent on counseling and coordination of care.    RTC 3 months  Patient advised to contact office with any questions, adverse effects, or acute worsening in signs and symptoms.  There are no diagnoses linked to this encounter.   Please see After Visit Summary for patient specific instructions.  Future Appointments  Date Time Provider Department Center  08/10/2023  5:20 PM Khori Underberg, Thereasa Solo, NP CP-CP None    No orders of the defined types were placed in this encounter.     -------------------------------

## 2023-09-18 ENCOUNTER — Other Ambulatory Visit: Payer: Self-pay

## 2023-09-18 ENCOUNTER — Telehealth: Payer: Self-pay | Admitting: Adult Health

## 2023-09-18 DIAGNOSIS — F902 Attention-deficit hyperactivity disorder, combined type: Secondary | ICD-10-CM

## 2023-09-18 MED ORDER — AMPHETAMINE-DEXTROAMPHETAMINE 30 MG PO TABS: 30.0000 mg | ORAL_TABLET | Freq: Two times a day (BID) | ORAL | 0 refills | Status: DC

## 2023-09-18 NOTE — Telephone Encounter (Signed)
Pended adderall 30 mg to [facility].

## 2023-09-18 NOTE — Telephone Encounter (Signed)
Next appt is 11/17/23. Requesting refill on Dextroamphetamine 30 mg called to:  [id number] [place] - [address] AT [address] [facility] [address]   Phone: [phone]  Fax: [phone]    Per patient it is in stock.

## 2023-10-19 ENCOUNTER — Telehealth: Payer: Self-pay | Admitting: Adult Health

## 2023-10-19 ENCOUNTER — Other Ambulatory Visit: Payer: Self-pay

## 2023-10-19 DIAGNOSIS — F902 Attention-deficit hyperactivity disorder, combined type: Secondary | ICD-10-CM

## 2023-10-19 MED ORDER — AMPHETAMINE-DEXTROAMPHETAMINE 30 MG PO TABS: 30.0000 mg | ORAL_TABLET | Freq: Two times a day (BID) | ORAL | 0 refills | Status: DC

## 2023-10-19 NOTE — Telephone Encounter (Signed)
Pt called asking for a refill on her adderall 30 mg. [facility] at [address] in [place], [place]

## 2023-10-19 NOTE — Telephone Encounter (Signed)
Pended addreall 30 mg to [facility].

## 2023-11-17 ENCOUNTER — Encounter: Payer: Self-pay | Admitting: Adult Health

## 2023-11-17 ENCOUNTER — Telehealth (INDEPENDENT_AMBULATORY_CARE_PROVIDER_SITE_OTHER): Payer: 59 | Admitting: Adult Health

## 2023-11-17 DIAGNOSIS — F428 Other obsessive-compulsive disorder: Secondary | ICD-10-CM | POA: Diagnosis not present

## 2023-11-17 DIAGNOSIS — F909 Attention-deficit hyperactivity disorder, unspecified type: Secondary | ICD-10-CM | POA: Diagnosis not present

## 2023-11-17 DIAGNOSIS — F411 Generalized anxiety disorder: Secondary | ICD-10-CM

## 2023-11-17 DIAGNOSIS — F902 Attention-deficit hyperactivity disorder, combined type: Secondary | ICD-10-CM

## 2023-11-17 MED ORDER — AMPHETAMINE-DEXTROAMPHETAMINE 30 MG PO TABS: 30.0000 mg | ORAL_TABLET | Freq: Every day | ORAL | 0 refills | Status: DC

## 2023-11-17 MED ORDER — DESVENLAFAXINE SUCCINATE ER 100 MG PO TB24: ORAL_TABLET | ORAL | 1 refills | Status: DC

## 2023-11-17 MED ORDER — AMPHETAMINE-DEXTROAMPHET ER 30 MG PO CP24: 30.0000 mg | ORAL_CAPSULE | Freq: Every day | ORAL | 0 refills | Status: DC

## 2023-11-17 NOTE — Progress Notes (Signed)
 SVARA TWYMAN [id number] 2000/10/26 22 y.o.  Virtual Visit via Video Note  I connected with pt @ on 11/17/23 at  8:00 AM EST by a video enabled telemedicine application and verified that I am speaking with the correct person using two identifiers.   I discussed the limitations of evaluation and management by telemedicine and the availability of in person appointments. The patient expressed understanding and agreed to proceed.  I discussed the assessment and treatment plan with the patient. The patient was provided an opportunity to ask questions and all were answered. The patient agreed with the plan and demonstrated an understanding of the instructions.   The patient was advised to call back or seek an in-person evaluation if the symptoms worsen or if the condition fails to improve as anticipated.  I provided 20 minutes of non-face-to-face time during this encounter.  The patient was located at home.  The provider was located at [facility].   Dorothyann Gibbs, NP   Subjective:   Patient ID:  Kathryn Blanchard is a 23 y.o. (DOB 05/24/2001) female.  Chief Complaint: No chief complaint on file.   HPI Kathryn Blanchard presents for follow-up of ADD, GAD, OCD.  Describes mood today as "ok". Pleasant. Denies tearfulness. Mood symptoms - denies depression. Reports stable interest and motivation. Reports some anxiety and irritability. Denies panic attacks - reports heightened anxiety at times. Denies worry, rumination and over thinking. Reports obsessive thoughts and acts. Mood is consistent. Stating "I feel like I'm doing ok overall". Feels like current medication are helpful but would like to try an IR and XR Adderall combination over the next month. Does not feel like current dosing is lasting throughout her day. Taking medications as prescribed.  Energy levels good. Active, has a regular exercise routine.   Enjoys some usual interests and activities. Single. Not dating. Lives in [place]  with a roommate. Spending time with family.  Appetite adequate. Weight loss - 143 pounds - 65". Sleeps well most nights. Averages 7 to 8 hours. Focus and concentration improved - would like to try an immediate release and XR Adderall. Completing tasks. Managing aspects of household. 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  Denies SI or HI.  Denies AH or VH. Denies self harm. Denies substance use.  Previous medication trials: Effexor, Buspar, Zoloft    Review of Systems:  Review of Systems  Musculoskeletal:  Negative for gait problem.  Neurological:  Negative for tremors.  Psychiatric/Behavioral:         Please refer to HPI    Medications: I have reviewed the patient's current medications.  Current Outpatient Medications  Medication Sig Dispense Refill   amphetamine-dextroamphetamine (ADDERALL) 30 MG tablet Take 1 tablet by mouth 2 (two) times daily. 60 tablet 0   desogestrel-ethinyl estradiol (APRI) 0.15-30 MG-MCG tablet TAKE 1 TABLET BY MOUTH EVERY DAY 84 tablet 2   desvenlafaxine (PRISTIQ) 100 MG 24 hr tablet TAKE 1 TABLET BY MOUTH EVERY DAY IN THE MORNING 90 tablet 1   No current facility-administered medications for this visit.    Medication Side Effects: None  Allergies: No Known Allergies  Past Medical History:  Diagnosis Date   Acne vulgaris    ADHD (attention deficit hyperactivity disorder)    Diaphoresis    Fatigue    Generalized anxiety disorder    h/o panic attacks   Obsessive-compulsive disorder     Family History  Problem Relation Age of Onset   Anxiety disorder Mother  ADD / ADHD Mother    Depression Mother    Asthma Mother    OCD Father    Hypertension Father    Hypercholesterolemia Father    Anxiety disorder Father    Alcohol abuse Neg Hx    Bipolar disorder Neg Hx    Dementia Neg Hx    Drug abuse Neg Hx    Paranoid behavior Neg Hx    Physical abuse Neg Hx    Schizophrenia Neg Hx    Seizures Neg Hx    Sexual abuse Neg Hx     Cancer Neg Hx    Diabetes Neg Hx    Heart disease Neg Hx     Social History   Socioeconomic History   Marital status: Single    Spouse name: Not on file   Number of children: Not on file   Years of education: 13   Highest education level: Some college, no degree  Occupational History   Occupation: not employed   Occupation: Consulting civil engineer  Tobacco Use   Smoking status: Never   Smokeless tobacco: Never  Vaping Use   Vaping status: Some Days   Substances: Synthetic cannabinoids  Substance and Sexual Activity   Alcohol use: Yes    Alcohol/week: 3.0 standard drinks of alcohol    Types: 3 Standard drinks or equivalent per week    Comment: 3-4 glasses of wine/week   Drug use: Yes    Comment: Delta 8 vape, at the end of the day 4-5 days/week   Sexual activity: Not Currently    Partners: Male    Birth control/protection: Pill    Comment: condoms (with new relationships)  Other Topics Concern   Not on file  Social History Narrative   Graduated from [employer] 01/2022. Majored in Theatre manager.      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      When home, lives with parents.   Older brother lives in [place]      Updated 03/2023   Social Drivers of Health   Financial Resource Strain: Patient Declined (03/27/2023)   Overall Financial Resource Strain (CARDIA)    Difficulty of Paying Living Expenses: Patient declined  Food Insecurity: No Food Insecurity (03/27/2023)   Hunger Vital Sign    Worried About Running Out of Food in the Last Year: Never true    Ran Out of Food in the Last Year: Never true  Transportation Needs: No Transportation Needs (03/27/2023)   PRAPARE - Administrator, Civil Service (Medical): No    Lack of Transportation (Non-Medical): No  Physical Activity: Sufficiently Active (03/27/2023)   Exercise Vital Sign    Days of Exercise per Week: 4 days    Minutes of Exercise per Session: 40  min  Stress: Patient Declined (03/27/2023)   [employer] of Occupational Health - Occupational Stress Questionnaire    Feeling of Stress : Patient declined  Social Connections: Moderately Isolated (03/27/2023)   Social Connection and Isolation Panel [NHANES]    Frequency of Communication with Friends and Family: More than three times a week    Frequency of Social Gatherings with Friends and Family: Three times a week    Attends Religious Services: 1 to 4 times per year    Active Member of Clubs or Organizations: No    Attends Banker Meetings: Never    Marital Status: Never married  Intimate Partner Violence: Not At Risk (03/27/2023)  Humiliation, Afraid, Rape, and Kick questionnaire    Fear of Current or Ex-Partner: No    Emotionally Abused: No    Physically Abused: No    Sexually Abused: No    Past Medical History, Surgical history, Social history, and Family history were reviewed and updated as appropriate.   Please see review of systems for further details on the patient's review from today.   Objective:   Physical Exam:  There were no vitals taken for this visit.  Physical Exam Constitutional:      General: She is not in acute distress. Musculoskeletal:        General: No deformity.  Neurological:     Mental Status: She is alert and oriented to person, place, and time.     Coordination: Coordination normal.  Psychiatric:        Attention and Perception: Attention and perception normal. She does not perceive auditory or visual hallucinations.        Mood and Affect: Affect is not labile, blunt, angry or inappropriate.        Speech: Speech normal.        Behavior: Behavior normal.        Thought Content: Thought content normal. Thought content is not paranoid or delusional. Thought content does not include homicidal or suicidal ideation. Thought content does not include homicidal or suicidal plan.        Cognition and Memory: Cognition and memory normal.         Judgment: Judgment normal.     Comments: Insight intact     Lab Review:  No results found for: "NA", "K", "CL", "CO2", "GLUCOSE", "BUN", "CREATININE", "CALCIUM", "PROT", "ALBUMIN", "AST", "ALT", "ALKPHOS", "BILITOT", "GFRNONAA", "GFRAA"     Component Value Date/Time   WBC 8.1 03/27/2023 1442   RBC 4.42 03/27/2023 1442   HGB 13.2 03/27/2023 1442   HCT 40.1 03/27/2023 1442   PLT 336 03/27/2023 1442   MCV 91 03/27/2023 1442   MCH 29.9 03/27/2023 1442   MCHC 32.9 03/27/2023 1442   RDW 13.2 03/27/2023 1442   LYMPHSABS 3.3 (H) 03/27/2023 1442   EOSABS 0.1 03/27/2023 1442   BASOSABS 0.1 03/27/2023 1442    No results found for: "POCLITH", "LITHIUM"   No results found for: "PHENYTOIN", "PHENOBARB", "VALPROATE", "CBMZ"   .res Assessment: Plan:    Plan:  Pristiq 100mg  daily  Adderall 30mg  twice daily   Gene sight testing on file  PDMP reviewed  RTC 4 weeks  20 minutes spent dedicated to the care of this patient on the date of this encounter to include pre-visit review of records, ordering of medication, post visit documentation, and face-to-face time with the patient discussing ADD, GAD and OCD. Discussed continuing current medication regimen.  Discussed potential benefits, risks, and side effects of stimulants with patient to include increased heart rate, palpitations, insomnia, increased anxiety, increased irritability, or decreased appetite.  Instructed patient to contact office if experiencing any significant tolerability issues.   Patient advised to contact office with any questions, adverse effects, or acute worsening in signs and symptoms.  There are no diagnoses linked to this encounter.   Please see After Visit Summary for patient specific instructions.  Future Appointments  Date Time Provider Department Center  11/17/2023  8:00 AM Burlon Centrella, Thereasa Solo, NP CP-CP None    No orders of the defined types were placed in this encounter.      -------------------------------

## 2023-11-20 ENCOUNTER — Telehealth: Payer: Self-pay | Admitting: Adult Health

## 2023-11-20 NOTE — Telephone Encounter (Signed)
 LVM per DPR that she already has a RF available at the requested pharmacy.

## 2023-11-20 NOTE — Telephone Encounter (Signed)
 Pt called 11:20 requesting Adderall IR to [facility]. Apt 3/28

## 2023-11-29 ENCOUNTER — Other Ambulatory Visit: Payer: Self-pay

## 2023-11-29 ENCOUNTER — Telehealth: Payer: Self-pay | Admitting: Adult Health

## 2023-11-29 DIAGNOSIS — F902 Attention-deficit hyperactivity disorder, combined type: Secondary | ICD-10-CM

## 2023-11-29 MED ORDER — AMPHETAMINE-DEXTROAMPHETAMINE 30 MG PO TABS: 30.0000 mg | ORAL_TABLET | Freq: Two times a day (BID) | ORAL | 0 refills | Status: DC

## 2023-11-29 NOTE — Telephone Encounter (Signed)
 Pended RF for March. Previous RF was for April.

## 2023-11-29 NOTE — Telephone Encounter (Signed)
 Pt called having issues with Rx for adderall IR. Has note with wrong date. RTC [phone]

## 2023-11-30 ENCOUNTER — Other Ambulatory Visit: Payer: Self-pay | Admitting: Family Medicine

## 2023-11-30 DIAGNOSIS — L700 Acne vulgaris: Secondary | ICD-10-CM

## 2023-11-30 DIAGNOSIS — N920 Excessive and frequent menstruation with regular cycle: Secondary | ICD-10-CM

## 2023-11-30 NOTE — Telephone Encounter (Signed)
 Sending for 3 months. Called pharmacy and they made a mistake and closed the rx.

## 2023-12-15 ENCOUNTER — Encounter: Payer: Self-pay | Admitting: Adult Health

## 2023-12-15 ENCOUNTER — Ambulatory Visit (HOSPITAL_COMMUNITY): Admission: EM | Admit: 2023-12-15 | Discharge: 2023-12-15 | Disposition: A | Discharge status: Home / Self Care

## 2023-12-15 ENCOUNTER — Telehealth: Admitting: Adult Health

## 2023-12-15 DIAGNOSIS — F909 Attention-deficit hyperactivity disorder, unspecified type: Secondary | ICD-10-CM

## 2023-12-15 DIAGNOSIS — F411 Generalized anxiety disorder: Secondary | ICD-10-CM

## 2023-12-15 DIAGNOSIS — F428 Other obsessive-compulsive disorder: Secondary | ICD-10-CM | POA: Diagnosis not present

## 2023-12-15 DIAGNOSIS — F902 Attention-deficit hyperactivity disorder, combined type: Secondary | ICD-10-CM

## 2023-12-15 NOTE — Discharge Instructions (Signed)

## 2023-12-15 NOTE — Progress Notes (Signed)
   12/15/23 1700  [facility] Triage Screening (Walk-ins at [facility] only)  How Did You Hear About [place]? Family/Friend  What Is the Reason for Your Visit/Call Today? Kathryn Blanchard presents to [facility] voluntarily accompanied by her mother. 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 Pt states that she is under investigation because of her attachment to the clients. Pt currently denies SI, HI, and AVH. Pt states that she had a 2 glasses of wine and used her Delta 8 vape pen.  How Long Has This Been Causing You Problems? 1-6 months  Have You Recently Had Any Thoughts About Hurting Yourself? No  Are You Planning to Commit Suicide/Harm Yourself At This time? No  Have you Recently Had Thoughts About Hurting Someone [name]? No  Are You Planning To Harm Someone At This Time? No  Physical Abuse Denies  Verbal Abuse Yes, past (Comment)  Sexual Abuse Denies  Exploitation of patient/patient's resources Yes, past (Comment)  Self-Neglect Denies  Are you currently experiencing any auditory, visual or other hallucinations? No  Have You Used Any Alcohol or Drugs in the Past 24 Hours? Yes  What Did You Use and How Much? last night - wine (2 glasses) & delta 8 vape pen  Do you have any current medical co-morbidities that require immediate attention? No  Clinician description of patient physical appearance/behavior: calm, cooperative, talkative  What Do You Feel Would Help You the Most Today? Social Support  If access to [facility] was not available, would you have sought care in the Emergency Department? No  Determination of Need Routine (7 days)  Options For Referral Medication Management;Outpatient Therapy

## 2023-12-15 NOTE — Progress Notes (Signed)
 Kathryn Blanchard [id number] Apr 05, 2001 23 y.o.  Virtual Visit via Video Note  I connected with pt @ on 12/15/23 at  2:00 PM EDT by a video enabled telemedicine application and verified that I am speaking with the correct person using two identifiers.   I discussed the limitations of evaluation and management by telemedicine and the availability of in person appointments. The patient expressed understanding and agreed to proceed.  I discussed the assessment and treatment plan with the patient. The patient was provided an opportunity to ask questions and all were answered. The patient agreed with the plan and demonstrated an understanding of the instructions.   The patient was advised to call back or seek an in-person evaluation if the symptoms worsen or if the condition fails to improve as anticipated.  I provided 30 minutes of non-face-to-face time during this encounter.  The patient was located at home.  The provider was located at [facility].   Dorothyann Gibbs, NP   Subjective:   Patient ID:  Kathryn Blanchard is a 23 y.o. (DOB 2000/10/10) female.  Chief Complaint: No chief complaint on file.   HPI Kathryn Blanchard presents for follow-up of ADD, GAD, OCD.  Describes mood today as "concerned". Pleasant. Denies tearfulness. Mood symptoms - denies depression. Reports stable interest and motivation. Denies anxiety and irritability. Denies panic attacks. Reports increased worry, rumination and over thinking. Denies obsessive thoughts and acts. Reports mood as stable - but "concerned". Stating "I feel like I'm doing ok - but feeling overwhelmed". Reports increased situational stressors over the past few weeks after leaving her job. She reports she had being doing some investigative psychology with her class and with one student in particular. She reports that it was an experimental investigation into PTSD. Feels like the stress of the situation has also made her consider her on trauma and PTSD  history. She also feels like her family is suffering from PTSD and has been discussing it with them. She reports ongoing concerns about the situation - the children and her family - even though she is no longer employed. She reports speaking with her family about her concerns and is also confiding with a friend. Reports taking medications as prescribed - with exception of Adderall - taking once daily. She also reports she has continued use of THC - delta 8 along with alcohol use. Reports last use of THC was 2 weeks ago.  Energy levels stable. Active, has a regular exercise routine.   Enjoys some usual interests and activities. Single. Not dating. Lives in [place] with a roommate. Spending time with family.  Appetite adequate. Weight loss - 125 to 130 pounds - previous weight - 143 pounds - 65". Sleeps well most nights. Averages 7 to 8 hours. Reports focus and concentration stable - taking one tablet daily for the past 5 days. Completing tasks. Managing aspects of household. 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 Denies SI or HI.  Denies AH or VH. Denies self harm. Denies substance use. Reports one alcohol drink a night 4 to 5 nights a week.  Reports using THC - delta 8 - last use a few weeks ago.   Previous medication trials: Effexor, Buspar, Zoloft   Review of Systems:  Review of Systems  Musculoskeletal:  Negative for gait problem.  Neurological:  Negative for tremors.  Psychiatric/Behavioral:         Please refer to HPI    Medications: I  have reviewed the patient's current medications.  Current Outpatient Medications  Medication Sig Dispense Refill   amphetamine-dextroamphetamine (ADDERALL XR) 30 MG 24 hr capsule Take 1 capsule (30 mg total) by mouth daily. 30 capsule 0   [START ON 01/12/2024] amphetamine-dextroamphetamine (ADDERALL) 30 MG tablet Take 1 tablet by mouth daily. 30 tablet 0   amphetamine-dextroamphetamine (ADDERALL) 30 MG tablet Take  1 tablet by mouth 2 (two) times daily. 60 tablet 0   desogestrel-ethinyl estradiol (ISIBLOOM) 0.15-30 MG-MCG tablet TAKE 1 TABLET BY MOUTH EVERY DAY 84 tablet 0   desvenlafaxine (PRISTIQ) 100 MG 24 hr tablet TAKE 1 TABLET BY MOUTH EVERY DAY IN THE MORNING 90 tablet 1   No current facility-administered medications for this visit.    Medication Side Effects: None  Allergies: No Known Allergies  Past Medical History:  Diagnosis Date   Acne vulgaris    ADHD (attention deficit hyperactivity disorder)    Diaphoresis    Fatigue    Generalized anxiety disorder    h/o panic attacks   Obsessive-compulsive disorder     Family History  Problem Relation Age of Onset   Anxiety disorder Mother    ADD / ADHD Mother    Depression Mother    Asthma Mother    OCD Father    Hypertension Father    Hypercholesterolemia Father    Anxiety disorder Father    Alcohol abuse Neg Hx    Bipolar disorder Neg Hx    Dementia Neg Hx    Drug abuse Neg Hx    Paranoid behavior Neg Hx    Physical abuse Neg Hx    Schizophrenia Neg Hx    Seizures Neg Hx    Sexual abuse Neg Hx    Cancer Neg Hx    Diabetes Neg Hx    Heart disease Neg Hx     Social History   Socioeconomic History   Marital status: Single    Spouse name: Not on file   Number of children: Not on file   Years of education: 13   Highest education level: Some college, no degree  Occupational History   Occupation: not employed   Occupation: Consulting civil engineer  Tobacco Use   Smoking status: Never   Smokeless tobacco: Never  Vaping Use   Vaping status: Some Days   Substances: Synthetic cannabinoids  Substance and Sexual Activity   Alcohol use: Yes    Alcohol/week: 3.0 standard drinks of alcohol    Types: 3 Standard drinks or equivalent per week    Comment: 3-4 glasses of wine/week   Drug use: Yes    Comment: Delta 8 vape, at the end of the day 4-5 days/week   Sexual activity: Not Currently    Partners: Male    Birth control/protection: Pill     Comment: condoms (with new relationships)  Other Topics Concern   Not on file  Social History Narrative   Graduated from [employer] 01/2022. Majored in Theatre manager.      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      When home, lives with parents.   Older brother lives in [place]      Updated 03/2023   Social Drivers of Health   Financial Resource Strain: Patient Declined (03/27/2023)   Overall Financial Resource Strain (CARDIA)    Difficulty of Paying Living Expenses: Patient declined  Food Insecurity: No Food Insecurity (03/27/2023)   Hunger Vital Sign    Worried About Running  Out of Food in the Last Year: Never true    Ran Out of Food in the Last Year: Never true  Transportation Needs: No Transportation Needs (03/27/2023)   PRAPARE - Administrator, Civil Service (Medical): No    Lack of Transportation (Non-Medical): No  Physical Activity: Sufficiently Active (03/27/2023)   Exercise Vital Sign    Days of Exercise per Week: 4 days    Minutes of Exercise per Session: 40 min  Stress: Patient Declined (03/27/2023)   [employer] of Occupational Health - Occupational Stress Questionnaire    Feeling of Stress : Patient declined  Social Connections: Moderately Isolated (03/27/2023)   Social Connection and Isolation Panel [NHANES]    Frequency of Communication with Friends and Family: More than three times a week    Frequency of Social Gatherings with Friends and Family: Three times a week    Attends Religious Services: 1 to 4 times per year    Active Member of Clubs or Organizations: No    Attends Banker Meetings: Never    Marital Status: Never married  Intimate Partner Violence: Not At Risk (03/27/2023)   Humiliation, Afraid, Rape, and Kick questionnaire    Fear of Current or Ex-Partner: No    Emotionally Abused: No    Physically Abused: No    Sexually Abused: No    Past Medical  History, Surgical history, Social history, and Family history were reviewed and updated as appropriate.   Please see review of systems for further details on the patient's review from today.   Objective:   Physical Exam:  There were no vitals taken for this visit.  Physical Exam Constitutional:      General: She is not in acute distress. Musculoskeletal:        General: No deformity.  Neurological:     Mental Status: She is alert and oriented to person, place, and time.     Coordination: Coordination normal.  Psychiatric:        Attention and Perception: Attention and perception normal. She does not perceive auditory or visual hallucinations.        Mood and Affect: Mood normal. Mood is not anxious or depressed. Affect is not labile, blunt, angry or inappropriate.        Speech: Speech normal.        Behavior: Behavior normal.        Thought Content: Thought content normal. Thought content is not paranoid or delusional. Thought content does not include homicidal or suicidal ideation. Thought content does not include homicidal or suicidal plan.        Cognition and Memory: Cognition and memory normal.        Judgment: Judgment normal.     Comments: Insight intact     Lab Review:  No results found for: "NA", "K", "CL", "CO2", "GLUCOSE", "BUN", "CREATININE", "CALCIUM", "PROT", "ALBUMIN", "AST", "ALT", "ALKPHOS", "BILITOT", "GFRNONAA", "GFRAA"     Component Value Date/Time   WBC 8.1 03/27/2023 1442   RBC 4.42 03/27/2023 1442   HGB 13.2 03/27/2023 1442   HCT 40.1 03/27/2023 1442   PLT 336 03/27/2023 1442   MCV 91 03/27/2023 1442   MCH 29.9 03/27/2023 1442   MCHC 32.9 03/27/2023 1442   RDW 13.2 03/27/2023 1442   LYMPHSABS 3.3 (H) 03/27/2023 1442   EOSABS 0.1 03/27/2023 1442   BASOSABS 0.1 03/27/2023 1442    No results found for: "POCLITH", "LITHIUM"   No results found for: "PHENYTOIN", "PHENOBARB", "VALPROATE", "CBMZ"   .  res Assessment: Plan:    Plan:  Called and  spoke with mother after receiving permission from patient. Mother on DPR with full disclosure privilege.   Mother also reporting concerns about patient's mood symptoms and behaviors over the past few weeks.  Discussed evaluation at ED with worsening symptoms and recent substance use. Mother agreed and will seek outside evaluation.  Currently taking Pristiq 100mg  daily   D/C Adderall 30mg  daily - taking one tablet daily.  Gene sight testing on file  PDMP reviewed  RTC 4 weeks  30 minutes spent dedicated to the care of this patient on the date of this encounter to include pre-visit review of records, ordering of medication, post visit documentation, and face-to-face time with the patient discussing ADD, GAD, OCD and substance use.  Discussed potential benefits, risks, and side effects of stimulants with patient to include increased heart rate, palpitations, insomnia, increased anxiety, increased irritability, or decreased appetite. Instructed patient to contact office if experiencing any significant tolerability issues.   Patient advised to contact office with any questions, adverse effects, or acute worsening in signs and symptoms.  There are no diagnoses linked to this encounter.   Please see After Visit Summary for patient specific instructions.  Future Appointments  Date Time Provider Department Center  12/15/2023  2:00 PM Brylynn Hanssen, Thereasa Solo, NP [facility] None    No orders of the defined types were placed in this encounter.     -------------------------------

## 2023-12-15 NOTE — ED Provider Notes (Signed)
 Behavioral Health Urgent Care Medical Screening Exam  Patient Name: ALEEHA Blanchard MRN: [id number] Date of Evaluation: 12/15/23 Chief Complaint:   Diagnosis:  Final diagnoses:  Generalized anxiety disorder    History of Present illness: Kathryn Blanchard is a 23 y.o. female.  Patient presents voluntarily to [facility] behavioral health for walk-in assessment.  Patient is accompanied by her mother, Kathryn Blanchard.  Patient prefers that her mother remain present during assessment.    Patient is assessed by this provider face-to-face.  Patient is seated in assessment area, no apparent distress.  She is alert and oriented, average eye contact.  She is appropriately groomed.  She is pleasant and cooperative.  She presents with anxious mood, congruent affect.  Kathryn Blanchard reports recent stressors include "I got a new team lead at work who has been basically terrorizing me since I started in September.  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  She has since been removed from the care of the specific children and now cares for multiple children throughout facility.  Patient perseverates regarding traumatic incidents occurring when she was 23 years old.  While she was traveling with her family, in [place], her cat ran away.  Patient's boyfriend had agreed to care for the cat.  Kathryn Blanchard reports she was manipulated by her friends who suggested boyfriend may have been responsible for loss of the cat.  Patient states "I am in an awareness state that the stress (of losing the) has affected the way that I navigate life since I was 23 years old."  Kathryn Blanchard's diagnoses include generalized anxiety disorder, attention deficit hyperactivity disorder and obsessive-compulsive disorder.  Previously her medications included Adderall IR and Adderall  extended release daily.  She stopped her medications 3 days ago, after unable to refill extended release.  Met with current prescriber, Yvette Rack, at [facility] today, Adderall discontinued completely.  Patient remains compliant with additional medications including Pristiq.  She denies history of inpatient psychiatric hospitalization.  No family mental health history reported.  Patient states "I am looking for an official diagnosis because I think I have PTSD.  I have wanted to look for therapist and I will do it now."  Patient and her mother confirm sleeping 8 hours per night.  Patient has been monitored by her mother for the past 2 nights.  Patient will reside with her mother over the coming days/weeks.  Typically resides in [place] with a roommate.  Patient using delta 8, THCa pen daily prior to today.  Patient will stop using THC, reviewed potential for paranoia.  Kathryn Blanchard denies suicidal and homicidal ideations.  No history of suicide attempts, no history of nonsuicidal self-harm.  There is no indication that patient is responding to internal stimuli, no evidence psychosis.  Patient offered support and encouragement.  Overnight observation admission offered, declined.  Patient's mother, Kathryn Blanchard,verbalizes understanding of treatment plan and strict return precautions.   Patient and family are educated and verbalize understanding of mental health resources and other crisis services in the community. They are instructed to call 911 and present to the nearest emergency room should patient experience any suicidal/homicidal ideation, auditory/visual/hallucinations, or detrimental worsening of mental health condition.     Flowsheet Row ED from 12/15/2023 in [facility]  C-SSRS RISK CATEGORY No  Risk       Psychiatric Specialty Exam  Presentation  General Appearance:Appropriate for Environment; Casual  Eye Contact:Good  Speech:Clear and Coherent; Normal  Rate  Speech Volume:Normal  Handedness:Right   Mood and Affect  Mood: Anxious  Affect: Congruent   Thought Process  Thought Processes: Coherent; Goal Directed; Linear  Descriptions of Associations:Intact  Orientation:Full (Time, Place and Person)  Thought Content:Logical; Perseveration    Hallucinations:None  Ideas of Reference:None  Suicidal Thoughts:No  Homicidal Thoughts:No   Sensorium  Memory: Immediate Good; Recent Good  Judgment: Good  Insight: Fair   Executive Functions  Concentration: Good  Attention Span: Good  Recall: Good  Fund of Knowledge: Good  Language: Good   Psychomotor Activity  Psychomotor Activity: Normal   Assets  Assets: Desire for Improvement; Communication Skills; Financial Resources/Insurance; Housing; Leisure Time; Physical Health; Resilience; Social Support   Sleep  Sleep: Good  Number of hours:  8   Physical Exam: Physical Exam Vitals and nursing note reviewed.  Constitutional:      Appearance: Normal appearance. She is well-developed.  HENT:     Head: Normocephalic and atraumatic.     Nose: Nose normal.  Cardiovascular:     Rate and Rhythm: Normal rate.  Pulmonary:     Effort: Pulmonary effort is normal.  Musculoskeletal:        General: Normal range of motion.     Cervical back: Normal range of motion.  Skin:    General: Skin is warm and dry.  Neurological:     Mental Status: She is alert and oriented to person, place, and time.  Psychiatric:        Attention and Perception: Attention and perception normal.        Mood and Affect: Affect normal. Mood is anxious.        Speech: Speech normal.        Behavior: Behavior normal. Behavior is cooperative.        Thought Content: Thought content normal.        Cognition and Memory: Cognition and memory normal.        Judgment: Judgment normal.    Review of Systems  Constitutional: Negative.   HENT: Negative.    Eyes: Negative.    Respiratory: Negative.    Cardiovascular: Negative.   Gastrointestinal: Negative.   Genitourinary: Negative.   Musculoskeletal: Negative.   Skin: Negative.   Neurological: Negative.   Psychiatric/Behavioral:  Positive for substance abuse. The patient is nervous/anxious.    Blood pressure (!) 119/91, pulse 98, temperature 98.4 F (36.9 C), temperature source Oral, SpO2 100%. There is no height or weight on file to calculate BMI.  Musculoskeletal: Strength & Muscle Tone: within normal limits Gait & Station: normal Patient leans: N/A   [facility] MSE Discharge Disposition for Follow up and Recommendations: Based on my evaluation the patient does not appear to have an emergency medical condition and can be discharged with resources and follow up care in outpatient services for Medication Management and Individual Therapy Patient reviewed with attending psychiatrist.  Will recommend follow-up with current prescriber and individual counseling options.  Additional resources provided including intensive outpatient and partial hospitalization programs.  Medications: Discontinue Adderall. Continue Pristiq.   Continue oral contraceptive.  Lenard Lance, FNP 12/15/2023, 5:57 PM

## 2023-12-26 ENCOUNTER — Encounter: Payer: Self-pay | Admitting: Family Medicine

## 2023-12-29 ENCOUNTER — Encounter: Payer: Self-pay | Admitting: Adult Health

## 2023-12-29 ENCOUNTER — Telehealth: Admitting: Adult Health

## 2023-12-29 DIAGNOSIS — F909 Attention-deficit hyperactivity disorder, unspecified type: Secondary | ICD-10-CM | POA: Diagnosis not present

## 2023-12-29 DIAGNOSIS — F428 Other obsessive-compulsive disorder: Secondary | ICD-10-CM | POA: Diagnosis not present

## 2023-12-29 DIAGNOSIS — F411 Generalized anxiety disorder: Secondary | ICD-10-CM

## 2023-12-29 DIAGNOSIS — F902 Attention-deficit hyperactivity disorder, combined type: Secondary | ICD-10-CM

## 2023-12-29 MED ORDER — RISPERIDONE 1 MG PO TABS: ORAL_TABLET | ORAL | 1 refills | Status: DC

## 2023-12-29 NOTE — Progress Notes (Signed)
 Kathryn Blanchard [id number] 12-10-2000 22 y.o.  Virtual Visit via Video Note  I connected with pt @ on 12/29/23 at  1:30 PM EDT by a video enabled telemedicine application and verified that I am speaking with the correct person using two identifiers.   I discussed the limitations of evaluation and management by telemedicine and the availability of in person appointments. The patient expressed understanding and agreed to proceed.  I discussed the assessment and treatment plan with the patient. The patient was provided an opportunity to ask questions and all were answered. The patient agreed with the plan and demonstrated an understanding of the instructions.   The patient was advised to call back or seek an in-person evaluation if the symptoms worsen or if the condition fails to improve as anticipated.  I provided 25 minutes of non-face-to-face time during this encounter.  The patient was located at home.  The provider was located at [facility].   Dorothyann Gibbs, NP   Subjective:   Patient ID:  Kathryn Blanchard is a 23 y.o. (DOB 12-24-2000) female.  Chief Complaint: No chief complaint on file.   HPI Kathryn Blanchard presents for follow-up of ADD, GAD, OCD.  Describes mood today as "worried". Pleasant. Denies tearfulness. Mood symptoms - denies depression, anxiety and irritability. Reports stable interest and motivation.   Denies panic attacks. Reports increased worry, rumination and over thinking over the past few weeks. Reports obsessive thoughts and acts. Reports mood as "alright". Stating "I feel like I'm doing ok - but I want to feel better". Reports increased situational stressors and is willing to consider other medication options to help manage current mood symptoms. She reports speaking with her family about her concerns. Reports taking medications as prescribed - Pristiq 100mg  daily and is willing to add Risperdal to help with mood symptoms. Denies any recent substance use.   Energy levels stable. Active, has a regular exercise routine.   Enjoys some usual interests and activities. Single. Not dating. Lives in [place] with a roommate. Spending time with family.  Appetite adequate. Weight loss - 125 to 130 pounds - 65". Sleeps well most nights. Averages 7 to 8 hours. Reports focus and concentration stable. Completing tasks. Managing aspects of household. Currently unemployed. Denies SI or HI.  Denies AH or VH. Denies self harm. Denies substance use. Denies recent alcohol use. Reports using THC - delta 8 - has d/c use.  Previous medication trials: Effexor, Buspar, Zoloft     Review of Systems:  Review of Systems  Musculoskeletal:  Negative for gait problem.  Neurological:  Negative for tremors.  Psychiatric/Behavioral:         Please refer to HPI    Medications: I have reviewed the patient's current medications.  Current Outpatient Medications  Medication Sig Dispense Refill   desogestrel-ethinyl estradiol (ISIBLOOM) 0.15-30 MG-MCG tablet TAKE 1 TABLET BY MOUTH EVERY DAY 84 tablet 0   No current facility-administered medications for this visit.    Medication Side Effects: None  Allergies: No Known Allergies  Past Medical History:  Diagnosis Date   Acne vulgaris    ADHD (attention deficit hyperactivity disorder)    Diaphoresis    Fatigue    Generalized anxiety disorder    h/o panic attacks   Obsessive-compulsive disorder     Family History  Problem Relation Age of Onset   Anxiety disorder Mother    ADD / ADHD Mother    Depression Mother    Asthma Mother  OCD Father    Hypertension Father    Hypercholesterolemia Father    Anxiety disorder Father    Alcohol abuse Neg Hx    Bipolar disorder Neg Hx    Dementia Neg Hx    Drug abuse Neg Hx    Paranoid behavior Neg Hx    Physical abuse Neg Hx    Schizophrenia Neg Hx    Seizures Neg Hx    Sexual abuse Neg Hx    Cancer Neg Hx    Diabetes Neg Hx    Heart disease Neg Hx      Social History   Socioeconomic History   Marital status: Single    Spouse name: Not on file   Number of children: Not on file   Years of education: 13   Highest education level: Some college, no degree  Occupational History   Occupation: not employed   Occupation: Consulting civil engineer  Tobacco Use   Smoking status: Never   Smokeless tobacco: Never  Vaping Use   Vaping status: Some Days   Substances: Synthetic cannabinoids  Substance and Sexual Activity   Alcohol use: Yes    Alcohol/week: 3.0 standard drinks of alcohol    Types: 3 Standard drinks or equivalent per week    Comment: 3-4 glasses of wine/week   Drug use: Yes    Comment: Delta 8 vape, at the end of the day 4-5 days/week   Sexual activity: Not Currently    Partners: Male    Birth control/protection: Pill    Comment: condoms (with new relationships)  Other Topics Concern   Not on file  Social History Narrative   Graduated from [employer] 01/2022. Majored in Theatre manager.      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      When home, lives with parents.   Older brother lives in [place]      Updated 03/2023   Social Drivers of Health   Financial Resource Strain: Patient Declined (03/27/2023)   Overall Financial Resource Strain (CARDIA)    Difficulty of Paying Living Expenses: Patient declined  Food Insecurity: No Food Insecurity (03/27/2023)   Hunger Vital Sign    Worried About Running Out of Food in the Last Year: Never true    Ran Out of Food in the Last Year: Never true  Transportation Needs: No Transportation Needs (03/27/2023)   PRAPARE - Administrator, Civil Service (Medical): No    Lack of Transportation (Non-Medical): No  Physical Activity: Sufficiently Active (03/27/2023)   Exercise Vital Sign    Days of Exercise per Week: 4 days    Minutes of Exercise per Session: 40 min  Stress: Patient Declined (03/27/2023)   [employer]  of Occupational Health - Occupational Stress Questionnaire    Feeling of Stress : Patient declined  Social Connections: Moderately Isolated (03/27/2023)   Social Connection and Isolation Panel [NHANES]    Frequency of Communication with Friends and Family: More than three times a week    Frequency of Social Gatherings with Friends and Family: Three times a week    Attends Religious Services: 1 to 4 times per year    Active Member of Clubs or Organizations: No    Attends Banker Meetings: Never    Marital Status: Never married  Intimate Partner Violence: Not At Risk (03/27/2023)   Humiliation, Afraid, Rape, and Kick questionnaire    Fear of Current or Ex-Partner: No  Emotionally Abused: No    Physically Abused: No    Sexually Abused: No    Past Medical History, Surgical history, Social history, and Family history were reviewed and updated as appropriate.   Please see review of systems for further details on the patient's review from today.   Objective:   Physical Exam:  There were no vitals taken for this visit.  Physical Exam Constitutional:      General: She is not in acute distress. Musculoskeletal:        General: No deformity.  Neurological:     Mental Status: She is alert and oriented to person, place, and time.     Coordination: Coordination normal.  Psychiatric:        Attention and Perception: Attention and perception normal. She does not perceive auditory or visual hallucinations.        Mood and Affect: Mood normal. Affect is not labile, blunt, angry or inappropriate.        Speech: Speech normal.        Behavior: Behavior normal.        Thought Content: Thought content normal. Thought content is not paranoid or delusional. Thought content does not include homicidal or suicidal ideation. Thought content does not include homicidal or suicidal plan.        Cognition and Memory: Cognition and memory normal.        Judgment: Judgment normal.     Comments:  Insight intact     Lab Review:  No results found for: "NA", "K", "CL", "CO2", "GLUCOSE", "BUN", "CREATININE", "CALCIUM", "PROT", "ALBUMIN", "AST", "ALT", "ALKPHOS", "BILITOT", "GFRNONAA", "GFRAA"     Component Value Date/Time   WBC 8.1 03/27/2023 1442   RBC 4.42 03/27/2023 1442   HGB 13.2 03/27/2023 1442   HCT 40.1 03/27/2023 1442   PLT 336 03/27/2023 1442   MCV 91 03/27/2023 1442   MCH 29.9 03/27/2023 1442   MCHC 32.9 03/27/2023 1442   RDW 13.2 03/27/2023 1442   LYMPHSABS 3.3 (H) 03/27/2023 1442   EOSABS 0.1 03/27/2023 1442   BASOSABS 0.1 03/27/2023 1442    No results found for: "POCLITH", "LITHIUM"   No results found for: "PHENYTOIN", "PHENOBARB", "VALPROATE", "CBMZ"   .res Assessment: Plan:    Plan:  Add Risperdal 1mg  at bedtime - take 1/2 tablet x 3 days, then increase to one tablet at bedtime.  Continue Pristiq 100mg  daily   Gene sight testing on file - reviewed for addition of Risperdal.  PDMP reviewed  RTC 4 weeks - will call next week with update on mood and medication.  30 minutes spent dedicated to the care of this patient on the date of this encounter to include pre-visit review of records, ordering of medication, post visit documentation, and face-to-face time with the patient discussing ADD, GAD, OCD and substance use.  Discussed potential benefits, risks, and side effects of stimulants with patient to include increased heart rate, palpitations, insomnia, increased anxiety, increased irritability, or decreased appetite. Instructed patient to contact office if experiencing any significant tolerability issues.   Patient advised to contact office with any questions, adverse effects, or acute worsening in signs and symptoms.  There are no diagnoses linked to this encounter.   Please see After Visit Summary for patient specific instructions.  Future Appointments  Date Time Provider Department Center  12/29/2023  1:30 PM Letty Salvi, Thereasa Solo, NP [facility]  None  01/11/2024 11:30 AM Joselyn Arrow, MD [facility]    No orders of the defined types were  placed in this encounter.     -------------------------------

## 2024-01-03 ENCOUNTER — Telehealth: Admitting: Adult Health

## 2024-01-10 NOTE — Progress Notes (Unsigned)
 No chief complaint on file.  Message from patient 4/8: "On March 25th, I was induced into a PTSD  -state at my workplace ([employer] Therapy). My Engineer, manufacturing and I came to a mutual agreement that I need to get proper help and assistance. Since then, I have experienced symptoms of clear disorientation, throwing up when triggered, thinking spells in the middle of the night and bleeding outside of my period. I have met with a trauma therapist twice in the last week and I will continue to build a relationship with her on a biweekly basis to access the help I need. I have a psychiatrist appointment on April 30th with a NP located in the same office as my trauma therapist. My family, my therapist, and I all seek urgency in this situation and I wanted to reach out to you to give you an update. I have personal resources that can explain this situation better. I can bring that in to your office or email a pdf to you."  She is under the care of Regina Mozingo (psych), last seen 12/29/23.  Last seen here in July, 2024, at which time she reported being on Pristiq .and Adderall.   She had genetic testing, and changed from buspar  to pristiq .  She reports she is on a low dose of pristiq , wonders if she should taper off or not--doesn't feel it does much.  Denies side effects from medications. Still having anxiety. Using Delta 8 a few days/week to help wind down/relax. Adderall has been helpful--much more productive when taking it.  She is now on Risperdal  1 mg at bedtime Last filled Adderall 30 mg #60 on 11/28/23.   PMH, PSH, SH reviewed   ROS:    PHYSICAL EXAM:  There were no vitals taken for this visit.  Wt Readings from Last 3 Encounters:  03/27/23 138 lb 3.2 oz (62.7 kg)  04/18/22 147 lb (66.7 kg)  03/16/22 152 lb 6.4 oz (69.1 kg)        ASSESSMENT/PLAN:

## 2024-01-11 ENCOUNTER — Encounter: Payer: Self-pay | Admitting: Family Medicine

## 2024-01-11 ENCOUNTER — Ambulatory Visit: Payer: Self-pay | Admitting: Family Medicine

## 2024-01-11 VITALS — BP 116/74 | HR 92 | Ht 66.0 in | Wt 124.8 lb

## 2024-01-11 DIAGNOSIS — F411 Generalized anxiety disorder: Secondary | ICD-10-CM

## 2024-01-11 DIAGNOSIS — R5383 Other fatigue: Secondary | ICD-10-CM | POA: Diagnosis not present

## 2024-01-11 DIAGNOSIS — R634 Abnormal weight loss: Secondary | ICD-10-CM

## 2024-01-12 ENCOUNTER — Telehealth: Admitting: Adult Health

## 2024-01-12 ENCOUNTER — Encounter: Payer: Self-pay | Admitting: Family Medicine

## 2024-01-12 ENCOUNTER — Encounter: Payer: Self-pay | Admitting: Adult Health

## 2024-01-12 DIAGNOSIS — F411 Generalized anxiety disorder: Secondary | ICD-10-CM

## 2024-01-12 DIAGNOSIS — F909 Attention-deficit hyperactivity disorder, unspecified type: Secondary | ICD-10-CM

## 2024-01-12 DIAGNOSIS — F902 Attention-deficit hyperactivity disorder, combined type: Secondary | ICD-10-CM

## 2024-01-12 DIAGNOSIS — F428 Other obsessive-compulsive disorder: Secondary | ICD-10-CM

## 2024-01-12 DIAGNOSIS — F1990 Other psychoactive substance use, unspecified, uncomplicated: Secondary | ICD-10-CM

## 2024-01-12 LAB — CBC WITH DIFFERENTIAL/PLATELET
Basophils Absolute: 0 x10E3/uL (ref 0.0–0.2)
Basos: 1 %
EOS (ABSOLUTE): 0 x10E3/uL (ref 0.0–0.4)
Eos: 0 %
Hematocrit: 40.6 % (ref 34.0–46.6)
Hemoglobin: 13.1 g/dL (ref 11.1–15.9)
Immature Grans (Abs): 0 x10E3/uL (ref 0.0–0.1)
Immature Granulocytes: 0 %
Lymphocytes Absolute: 2 x10E3/uL (ref 0.7–3.1)
Lymphs: 26 %
MCH: 29.8 pg (ref 26.6–33.0)
MCHC: 32.3 g/dL (ref 31.5–35.7)
MCV: 92 fL (ref 79–97)
Monocytes Absolute: 0.6 x10E3/uL (ref 0.1–0.9)
Monocytes: 8 %
Neutrophils Absolute: 4.9 x10E3/uL (ref 1.4–7.0)
Neutrophils: 65 %
Platelets: 329 x10E3/uL (ref 150–450)
RBC: 4.4 x10E6/uL (ref 3.77–5.28)
RDW: 13.1 % (ref 11.7–15.4)
WBC: 7.5 x10E3/uL (ref 3.4–10.8)

## 2024-01-12 LAB — COMPREHENSIVE METABOLIC PANEL WITH GFR
ALT: 21 IU/L (ref 0–32)
AST: 16 IU/L (ref 0–40)
Albumin: 4.7 g/dL (ref 4.0–5.0)
Alkaline Phosphatase: 76 IU/L (ref 44–121)
BUN/Creatinine Ratio: 11 (ref 9–23)
BUN: 9 mg/dL (ref 6–20)
Bilirubin Total: 0.3 mg/dL (ref 0.0–1.2)
CO2: 21 mmol/L (ref 20–29)
Calcium: 9.8 mg/dL (ref 8.7–10.2)
Chloride: 103 mmol/L (ref 96–106)
Creatinine, Ser: 0.83 mg/dL (ref 0.57–1.00)
Globulin, Total: 2.5 g/dL (ref 1.5–4.5)
Glucose: 92 mg/dL (ref 70–99)
Potassium: 4.9 mmol/L (ref 3.5–5.2)
Sodium: 138 mmol/L (ref 134–144)
Total Protein: 7.2 g/dL (ref 6.0–8.5)
eGFR: 102 mL/min/1.73 (ref >59)

## 2024-01-12 LAB — TSH: TSH: 0.852 u[IU]/mL (ref 0.450–4.500)

## 2024-01-12 LAB — C-REACTIVE PROTEIN

## 2024-01-12 MED ORDER — RISPERIDONE 1 MG PO TABS
ORAL_TABLET | ORAL | 2 refills | Status: DC
Start: 2024-01-12 — End: 2024-04-10

## 2024-01-12 NOTE — Progress Notes (Signed)
 Kathryn Blanchard [id number] 2001/05/01 23 y.o.  Virtual Visit via Video Note  I connected with pt @ on 01/12/24 at  9:00 AM EDT by a video enabled telemedicine application and verified that I am speaking with the correct person using two identifiers.   I discussed the limitations of evaluation and management by telemedicine and the availability of in person appointments. The patient expressed understanding and agreed to proceed.  I discussed the assessment and treatment plan with the patient. The patient was provided an opportunity to ask questions and all were answered. The patient agreed with the plan and demonstrated an understanding of the instructions.   The patient was advised to call back or seek an in-person evaluation if the symptoms worsen or if the condition fails to improve as anticipated.  I provided 30 minutes of non-face-to-face time during this encounter.  The patient was located at home.  The provider was located at [facility].   Reagan Camera, NP   Subjective:   Patient ID:  Kathryn Blanchard is a 23 y.o. (DOB 10-27-2000) female.  Chief Complaint: No chief complaint on file.   HPI Kathryn Blanchard presents for follow-up of ADD, GAD and OCD.  Accompanied by mother for portion of interview.  Describes mood today as "a little better". Pleasant. Denies tearfulness. Mood symptoms - reports depression, anxiety and irritability. Reports period of heightened anxiety. Reports improved interest and motivation - getting outside, painting and scrap booking. Reports panic attacks - every 3 days. Reports increased worry, rumination and over thinking over thinking. Reports obsessive thoughts and acts. Reports getting hyper focused. Reports paranoia - "someone out to get me". Denies any recent substance use. Reports mood as variable. Stating "I feel like I'm improving - meeting milestones every day". Reports taking medications as prescribed - Pristiq  100mg  daily and Risperdal  1mg  to  help with mood symptoms.  Energy levels improved. Active, walking daily. Enjoys some usual interests and activities. Single. Not dating. Lives at home. Spending time with family.  Appetite adequate. Weight stable - 125 pounds - 65". Sleeps well most nights. Averages 8 to 9 hours. Reports some daytime napping.  Reports focus and concentration difficulties - getting hyper-focused, but getting things done. Completing tasks. Managing aspects of household. Currently unemployed. Denies SI or HI.  Denies AH or VH. Denies self harm. Denies substance use. Denies recent alcohol use. Reports using THC - delta 8 - has d/c use.  Previous medication trials: Effexor , Buspar , Zoloft     Review of Systems:  Review of Systems  Musculoskeletal:  Negative for gait problem.  Neurological:  Negative for tremors.  Psychiatric/Behavioral:         Please refer to HPI    Medications: I have reviewed the patient's current medications.  Current Outpatient Medications  Medication Sig Dispense Refill   desogestrel -ethinyl estradiol  (ISIBLOOM ) 0.15-30 MG-MCG tablet TAKE 1 TABLET BY MOUTH EVERY DAY 84 tablet 0   desvenlafaxine  (PRISTIQ ) 100 MG 24 hr tablet Take 100 mg by mouth daily.     risperiDONE  (RISPERDAL ) 1 MG tablet Take 1/2 tablet at bedtime for three nights, then take one tablet at bedtime. 30 tablet 1   No current facility-administered medications for this visit.    Medication Side Effects: None  Allergies: No Known Allergies  Past Medical History:  Diagnosis Date   Acne vulgaris    ADHD (attention deficit hyperactivity disorder)    Diaphoresis    Fatigue    Generalized anxiety disorder    h/o  panic attacks   Obsessive-compulsive disorder     Family History  Problem Relation Age of Onset   Anxiety disorder Mother    ADD / ADHD Mother    Depression Mother    Asthma Mother    OCD Father    Hypertension Father    Hypercholesterolemia Father    Anxiety disorder Father    Alcohol  abuse Neg Hx    Bipolar disorder Neg Hx    Dementia Neg Hx    Drug abuse Neg Hx    Paranoid behavior Neg Hx    Physical abuse Neg Hx    Schizophrenia Neg Hx    Seizures Neg Hx    Sexual abuse Neg Hx    Cancer Neg Hx    Diabetes Neg Hx    Heart disease Neg Hx     Social History   Socioeconomic History   Marital status: Single    Spouse name: Not on file   Number of children: Not on file   Years of education: 13   Highest education level: Bachelor's degree (e.g., BA, AB, BS)  Occupational History   Occupation: not employed   Occupation: Consulting civil engineer  Tobacco Use   Smoking status: Never   Smokeless tobacco: Never  Vaping Use   Vaping status: Some Days   Substances: Synthetic cannabinoids  Substance and Sexual Activity   Alcohol use: Yes    Alcohol/week: 3.0 standard drinks of alcohol    Types: 3 Standard drinks or equivalent per week    Comment: 3-4 glasses of wine/week   Drug use: Yes    Comment: Delta 8 vape, at the end of the day 4-5 days/week   Sexual activity: Not Currently    Partners: Male    Birth control/protection: Pill    Comment: condoms (with new relationships)  Other Topics Concern   Not on file  Social History Narrative   Graduated from [employer] 01/2022. Majored in Theatre manager.      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      When home, lives with parents.   Older brother lives in [place]      Updated 03/2023   Social Drivers of Health   Financial Resource Strain: Medium Risk (01/08/2024)   Overall Financial Resource Strain (CARDIA)    Difficulty of Paying Living Expenses: Somewhat hard  Food Insecurity: Food Insecurity Present (01/08/2024)   Hunger Vital Sign    Worried About Running Out of Food in the Last Year: Sometimes true    Ran Out of Food in the Last Year: Never true  Transportation Needs: No Transportation Needs (01/08/2024)   PRAPARE - Scientist, research (physical sciences) (Medical): No    Lack of Transportation (Non-Medical): No  Physical Activity: Sufficiently Active (01/08/2024)   Exercise Vital Sign    Days of Exercise per Week: 5 days    Minutes of Exercise per Session: 30 min  Stress: Stress Concern Present (01/08/2024)   [employer] of Occupational Health - Occupational Stress Questionnaire    Feeling of Stress : To some extent  Social Connections: Socially Isolated (01/08/2024)   Social Connection and Isolation Panel [NHANES]    Frequency of Communication with Friends and Family: Once a week    Frequency of Social Gatherings with Friends and Family: Once a week    Attends Religious Services: Never    Database administrator or Organizations: No    Attends [employer]  or Organization Meetings: Never    Marital Status: Never married  Intimate Partner Violence: Not At Risk (03/27/2023)   Humiliation, Afraid, Rape, and Kick questionnaire    Fear of Current or Ex-Partner: No    Emotionally Abused: No    Physically Abused: No    Sexually Abused: No    Past Medical History, Surgical history, Social history, and Family history were reviewed and updated as appropriate.   Please see review of systems for further details on the patient's review from today.   Objective:   Physical Exam:  LMP 12/25/2023 Comment: has been having breakthrough bleeding, week before and week after  Physical Exam Constitutional:      General: She is not in acute distress. Musculoskeletal:        General: No deformity.  Neurological:     Mental Status: She is alert and oriented to person, place, and time.     Coordination: Coordination normal.  Psychiatric:        Attention and Perception: Attention and perception normal. She does not perceive auditory or visual hallucinations.        Mood and Affect: Mood normal. Affect is not labile, blunt, angry or inappropriate.        Speech: Speech normal.        Behavior: Behavior normal.        Thought Content:  Thought content normal. Thought content is not paranoid or delusional. Thought content does not include homicidal or suicidal ideation. Thought content does not include homicidal or suicidal plan.        Cognition and Memory: Cognition and memory normal.        Judgment: Judgment normal.     Comments: Insight intact     Lab Review:     Component Value Date/Time   NA 138 01/11/2024 1213   K 4.9 01/11/2024 1213   CL 103 01/11/2024 1213   CO2 21 01/11/2024 1213   GLUCOSE 92 01/11/2024 1213   BUN 9 01/11/2024 1213   CREATININE 0.83 01/11/2024 1213   CALCIUM 9.8 01/11/2024 1213   PROT 7.2 01/11/2024 1213   ALBUMIN 4.7 01/11/2024 1213   AST 16 01/11/2024 1213   ALT 21 01/11/2024 1213   ALKPHOS 76 01/11/2024 1213   BILITOT 0.3 01/11/2024 1213       Component Value Date/Time   WBC 7.5 01/11/2024 1213   RBC 4.40 01/11/2024 1213   HGB 13.1 01/11/2024 1213   HCT 40.6 01/11/2024 1213   PLT 329 01/11/2024 1213   MCV 92 01/11/2024 1213   MCH 29.8 01/11/2024 1213   MCHC 32.3 01/11/2024 1213   RDW 13.1 01/11/2024 1213   LYMPHSABS 2.0 01/11/2024 1213   EOSABS 0.0 01/11/2024 1213   BASOSABS 0.0 01/11/2024 1213    No results found for: "POCLITH", "LITHIUM"   No results found for: "PHENYTOIN", "PHENOBARB", "VALPROATE", "CBMZ"   .res Assessment: Plan:    Plan:  Increase Risperdal  1mg  at bedtime to 1.5mg  at bedtime for mood symptoms.    Continue Pristiq  100mg  daily   Gene sight testing on file - reviewed for addition of Risperdal .  PDMP reviewed  RTC 2 weeks   Set up with therapist - Delmer Ferraris  30 minutes spent dedicated to the care of this patient on the date of this encounter to include pre-visit review of records, ordering of medication, post visit documentation, and face-to-face time with the patient discussing ADD, GAD, OCD and substance use.  Discussed potential metabolic side effects associated with  atypical antipsychotics, as well as potential risk for movement  side effects. Advised pt to contact office if movement side effects occur.    Discussed potential benefits, risks, and side effects of stimulants with patient to include increased heart rate, palpitations, insomnia, increased anxiety, increased irritability, or decreased appetite. Instructed patient to contact office if experiencing any significant tolerability issues.   Patient advised to contact office with any questions, adverse effects, or acute worsening in signs and symptoms.  There are no diagnoses linked to this encounter.   Please see After Visit Summary for patient specific instructions.  Future Appointments  Date Time Provider Department Center  01/12/2024  9:00 AM Melannie Metzner Nattalie, NP [facility] None  04/15/2024  1:45 PM Roosvelt Colla, MD [facility]    No orders of the defined types were placed in this encounter.     -------------------------------

## 2024-01-23 ENCOUNTER — Encounter: Payer: Self-pay | Admitting: Professional Counselor

## 2024-01-23 ENCOUNTER — Ambulatory Visit: Admitting: Mental Health

## 2024-01-23 ENCOUNTER — Ambulatory Visit: Admitting: Professional Counselor

## 2024-01-23 DIAGNOSIS — F422 Mixed obsessional thoughts and acts: Secondary | ICD-10-CM | POA: Diagnosis not present

## 2024-01-23 DIAGNOSIS — F22 Delusional disorders: Secondary | ICD-10-CM

## 2024-01-23 DIAGNOSIS — F3189 Other bipolar disorder: Secondary | ICD-10-CM | POA: Diagnosis not present

## 2024-01-23 DIAGNOSIS — F411 Generalized anxiety disorder: Secondary | ICD-10-CM
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
## 2024-01-26 ENCOUNTER — Encounter: Payer: Self-pay | Admitting: Adult Health

## 2024-01-26 ENCOUNTER — Telehealth: Admitting: Adult Health

## 2024-01-26 DIAGNOSIS — F988 Other specified behavioral and emotional disorders with onset usually occurring in childhood and adolescence: Secondary | ICD-10-CM

## 2024-01-26 DIAGNOSIS — F902 Attention-deficit hyperactivity disorder, combined type: Secondary | ICD-10-CM

## 2024-01-26 DIAGNOSIS — F428 Other obsessive-compulsive disorder: Secondary | ICD-10-CM | POA: Diagnosis not present

## 2024-01-26 DIAGNOSIS — F1990 Other psychoactive substance use, unspecified, uncomplicated: Secondary | ICD-10-CM | POA: Diagnosis not present

## 2024-01-26 DIAGNOSIS — F411 Generalized anxiety disorder: Secondary | ICD-10-CM

## 2024-01-26 MED ORDER — DESVENLAFAXINE SUCCINATE ER 100 MG PO TB24: 100.0000 mg | ORAL_TABLET | Freq: Every day | ORAL | 2 refills | Status: DC

## 2024-01-26 NOTE — Progress Notes (Signed)
 Kathryn Blanchard [id number] 2001/09/08 23 y.o.  Virtual Visit via Video Note  I connected with pt @ on 01/26/24 at  9:30 AM EDT by a video enabled telemedicine application and verified that I am speaking with the correct person using two identifiers.   I discussed the limitations of evaluation and management by telemedicine and the availability of in person appointments. The patient expressed understanding and agreed to proceed.  I discussed the assessment and treatment plan with the patient. The patient was provided an opportunity to ask questions and all were answered. The patient agreed with the plan and demonstrated an understanding of the instructions.   The patient was advised to call back or seek an in-person evaluation if the symptoms worsen or if the condition fails to improve as anticipated.  I provided 30 minutes of non-face-to-face time during this encounter.  The patient was located at home.  The provider was located at [facility].   Reagan Camera, NP   Subjective:   Patient ID:  Kathryn Blanchard is a 23 y.o. (DOB 03/05/2001) female.  Chief Complaint: No chief complaint on file.   HPI Kathryn Blanchard presents for follow-up of ADD, GAD and OCD.  Describes mood today as "better". Pleasant. Denies tearfulness. Mood symptoms - reports decreased depression, anxiety and irritability - "still having some ups and downs".  Reports improved interest and motivation. Reports panic attacks - "trying to learn how to irradiate those". Reports decreased worry, rumination and over thinking. Reports less obsessive thoughts and acts. Reports getting hyper focused - "not as bad". Reports decreased paranoia - able to recognize it better - "doing better about that - trying to overcome it". Denies any recent substance use. Reports mood as variable - "seeing improvements". Stating "I feel like I'm doing better". Reports taking medications as prescribed - Pristiq  100mg  daily and Risperdal  1.5 mg  to help with mood symptoms.  Energy levels improved. Active, walking daily. Enjoys some usual interests and activities. Single. Not dating. Lives at home. Spending time with family.  Appetite adequate. Weight stable - 125 pounds - 65". Sleeps well most nights. Averages 8 or more hours.  Reports focus and concentration is improving. Completing tasks. Managing aspects of household. Currently unemployed. Denies SI or HI.  Denies AH or VH. Denies self harm. Denies substance use. Denies recent alcohol use. Denies THC - delta 8.   Previous medication trials: Effexor , Buspar , Zoloft    Review of Systems:  Review of Systems  Musculoskeletal:  Negative for gait problem.  Neurological:  Negative for tremors.  Psychiatric/Behavioral:         Please refer to HPI    Medications: I have reviewed the patient's current medications.  Current Outpatient Medications  Medication Sig Dispense Refill   desogestrel -ethinyl estradiol  (ISIBLOOM ) 0.15-30 MG-MCG tablet TAKE 1 TABLET BY MOUTH EVERY DAY 84 tablet 0   desvenlafaxine  (PRISTIQ ) 100 MG 24 hr tablet Take 100 mg by mouth daily.     risperiDONE  (RISPERDAL ) 1 MG tablet Take one and 1/2 tablets at bedtime. 45 tablet 2   No current facility-administered medications for this visit.    Medication Side Effects: None  Allergies: No Known Allergies  Past Medical History:  Diagnosis Date   Acne vulgaris    ADHD (attention deficit hyperactivity disorder)    Diaphoresis    Fatigue    Generalized anxiety disorder    h/o panic attacks   Obsessive-compulsive disorder     Family History  Problem Relation Age of  Onset   Anxiety disorder Mother    ADD / ADHD Mother    Depression Mother    Asthma Mother    OCD Father    Hypertension Father    Hypercholesterolemia Father    Anxiety disorder Father    Alcohol abuse Neg Hx    Bipolar disorder Neg Hx    Dementia Neg Hx    Drug abuse Neg Hx    Paranoid behavior Neg Hx    Physical abuse Neg Hx     Schizophrenia Neg Hx    Seizures Neg Hx    Sexual abuse Neg Hx    Cancer Neg Hx    Diabetes Neg Hx    Heart disease Neg Hx     Social History   Socioeconomic History   Marital status: Single    Spouse name: Not on file   Number of children: Not on file   Years of education: 13   Highest education level: Bachelor's degree (e.g., BA, AB, BS)  Occupational History   Occupation: not employed   Occupation: Consulting civil engineer  Tobacco Use   Smoking status: Never   Smokeless tobacco: Never  Vaping Use   Vaping status: Some Days   Substances: Synthetic cannabinoids  Substance and Sexual Activity   Alcohol use: Yes    Alcohol/week: 3.0 standard drinks of alcohol    Types: 3 Standard drinks or equivalent per week    Comment: 3-4 glasses of wine/week   Drug use: Yes    Comment: Delta 8 vape, at the end of the day 4-5 days/week   Sexual activity: Not Currently    Partners: Male    Birth control/protection: Pill    Comment: condoms (with new relationships)  Other Topics Concern   Not on file  Social History Narrative   Graduated from [employer] 01/2022. Majored in Theatre manager.      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      When home, lives with parents.   Older brother lives in [place]      Updated 03/2023   Social Drivers of Health   Financial Resource Strain: Medium Risk (01/08/2024)   Overall Financial Resource Strain (CARDIA)    Difficulty of Paying Living Expenses: Somewhat hard  Food Insecurity: Food Insecurity Present (01/08/2024)   Hunger Vital Sign    Worried About Running Out of Food in the Last Year: Sometimes true    Ran Out of Food in the Last Year: Never true  Transportation Needs: No Transportation Needs (01/08/2024)   PRAPARE - Administrator, Civil Service (Medical): No    Lack of Transportation (Non-Medical): No  Physical Activity: Sufficiently Active (01/08/2024)   Exercise  Vital Sign    Days of Exercise per Week: 5 days    Minutes of Exercise per Session: 30 min  Stress: Stress Concern Present (01/08/2024)   [employer] of Occupational Health - Occupational Stress Questionnaire    Feeling of Stress : To some extent  Social Connections: Socially Isolated (01/08/2024)   Social Connection and Isolation Panel [NHANES]    Frequency of Communication with Friends and Family: Once a week    Frequency of Social Gatherings with Friends and Family: Once a week    Attends Religious Services: Never    Database administrator or Organizations: No    Attends Banker Meetings: Never    Marital Status: Never married  Intimate Partner Violence: Not At  Risk (03/27/2023)   Humiliation, Afraid, Rape, and Kick questionnaire    Fear of Current or Ex-Partner: No    Emotionally Abused: No    Physically Abused: No    Sexually Abused: No    Past Medical History, Surgical history, Social history, and Family history were reviewed and updated as appropriate.   Please see review of systems for further details on the patient's review from today.   Objective:   Physical Exam:  LMP 12/25/2023 Comment: has been having breakthrough bleeding, week before and week after  Physical Exam Constitutional:      General: She is not in acute distress. Musculoskeletal:        General: No deformity.  Neurological:     Mental Status: She is alert and oriented to person, place, and time.     Coordination: Coordination normal.  Psychiatric:        Attention and Perception: Attention and perception normal. She does not perceive auditory or visual hallucinations.        Mood and Affect: Mood normal. Affect is not labile, blunt, angry or inappropriate.        Speech: Speech normal.        Behavior: Behavior normal.        Thought Content: Thought content normal. Thought content is not paranoid or delusional. Thought content does not include homicidal or suicidal ideation. Thought  content does not include homicidal or suicidal plan.        Cognition and Memory: Cognition and memory normal.        Judgment: Judgment normal.     Comments: Insight intact     Lab Review:     Component Value Date/Time   NA 138 01/11/2024 1213   K 4.9 01/11/2024 1213   CL 103 01/11/2024 1213   CO2 21 01/11/2024 1213   GLUCOSE 92 01/11/2024 1213   BUN 9 01/11/2024 1213   CREATININE 0.83 01/11/2024 1213   CALCIUM 9.8 01/11/2024 1213   PROT 7.2 01/11/2024 1213   ALBUMIN 4.7 01/11/2024 1213   AST 16 01/11/2024 1213   ALT 21 01/11/2024 1213   ALKPHOS 76 01/11/2024 1213   BILITOT 0.3 01/11/2024 1213       Component Value Date/Time   WBC 7.5 01/11/2024 1213   RBC 4.40 01/11/2024 1213   HGB 13.1 01/11/2024 1213   HCT 40.6 01/11/2024 1213   PLT 329 01/11/2024 1213   MCV 92 01/11/2024 1213   MCH 29.8 01/11/2024 1213   MCHC 32.3 01/11/2024 1213   RDW 13.1 01/11/2024 1213   LYMPHSABS 2.0 01/11/2024 1213   EOSABS 0.0 01/11/2024 1213   BASOSABS 0.0 01/11/2024 1213    No results found for: "POCLITH", "LITHIUM"   No results found for: "PHENYTOIN", "PHENOBARB", "VALPROATE", "CBMZ"   .res Assessment: Plan:    Plan:  Risperdal  1mg  at bedtime to 1.5mg  at bedtime for mood symptoms.    Continue Pristiq  100mg  daily   Gene sight testing on file - reviewed for addition of Risperdal .  PDMP reviewed  RTC 4 weeks   Set up with therapist - Delmer Ferraris  30 minutes spent dedicated to the care of this patient on the date of this encounter to include pre-visit review of records, ordering of medication, post visit documentation, and face-to-face time with the patient discussing ADD, GAD, OCD and substance use.  Discussed potential metabolic side effects associated with atypical antipsychotics, as well as potential risk for movement side effects. Advised pt to contact office if movement  side effects occur.    Discussed potential benefits, risks, and side effects of stimulants with  patient to include increased heart rate, palpitations, insomnia, increased anxiety, increased irritability, or decreased appetite. Instructed patient to contact office if experiencing any significant tolerability issues.   Patient advised to contact office with any questions, adverse effects, or acute worsening in signs and symptoms.  There are no diagnoses linked to this encounter.   Please see After Visit Summary for patient specific instructions.  Future Appointments  Date Time Provider Department Center  03/21/2024  3:00 PM Anthon Kins [facility] CP-CP None  03/26/2024  9:00 AM Anthon Kins, [facility] CP-CP None  04/01/2024  9:00 AM Anthon Kins, [facility] CP-CP None  04/04/2024 11:00 AM Anthon Kins, [facility] CP-CP None  04/09/2024  9:00 AM Anthon Kins, [facility] CP-CP None  04/11/2024 10:00 AM Anthon Kins, [facility] CP-CP None  04/15/2024  1:45 PM Roosvelt Colla, MD P[facility]    No orders of the defined types were placed in this encounter.     -------------------------------

## 2024-02-06 ENCOUNTER — Telehealth: Payer: Self-pay | Admitting: Adult Health

## 2024-02-06 NOTE — Telephone Encounter (Signed)
 Pt last seen 5/9 with RTC in 4 weeks. She would like to schedule an earlier appt.

## 2024-02-06 NOTE — Telephone Encounter (Signed)
 LVM to [facility]

## 2024-02-06 NOTE — Telephone Encounter (Signed)
 Mack called and LM at 4:27 and requested a call back.  Did not give any details.  Appt 6/6

## 2024-02-09 ENCOUNTER — Encounter: Payer: Self-pay | Admitting: Adult Health

## 2024-02-09 ENCOUNTER — Telehealth (INDEPENDENT_AMBULATORY_CARE_PROVIDER_SITE_OTHER): Admitting: Adult Health

## 2024-02-09 DIAGNOSIS — F411 Generalized anxiety disorder: Secondary | ICD-10-CM

## 2024-02-09 DIAGNOSIS — F428 Other obsessive-compulsive disorder: Secondary | ICD-10-CM

## 2024-02-09 DIAGNOSIS — F1990 Other psychoactive substance use, unspecified, uncomplicated: Secondary | ICD-10-CM | POA: Diagnosis not present

## 2024-02-09 DIAGNOSIS — F902 Attention-deficit hyperactivity disorder, combined type: Secondary | ICD-10-CM | POA: Diagnosis not present

## 2024-02-09 MED ORDER — BUSPIRONE HCL 10 MG PO TABS: 10.0000 mg | ORAL_TABLET | Freq: Two times a day (BID) | ORAL | 2 refills | Status: DC

## 2024-02-09 NOTE — Progress Notes (Signed)
 Kathryn Blanchard [id number] 2000-11-22 23 y.o.  Virtual Visit via Video Note  I connected with pt @ on 02/09/24 at 12:00 PM EDT by a video enabled telemedicine application and verified that I am speaking with the correct person using two identifiers.   I discussed the limitations of evaluation and management by telemedicine and the availability of in person appointments. The patient expressed understanding and agreed to proceed.  I discussed the assessment and treatment plan with the patient. The patient was provided an opportunity to ask questions and all were answered. The patient agreed with the plan and demonstrated an understanding of the instructions.   The patient was advised to call back or seek an in-person evaluation if the symptoms worsen or if the condition fails to improve as anticipated.  I provided 30 minutes of non-face-to-face time during this encounter.  The patient was located at home.  The provider was located at [facility].   Reagan Camera, NP   Subjective:   Patient ID:  Kathryn Blanchard is a 23 y.o. (DOB 04-10-2001) female.  Chief Complaint: No chief complaint on file.   HPI [name] presents for follow-up of ADD, GAD and OCD.  Describes mood today as "ok". Pleasant. Denies tearfulness. Mood symptoms - reports depression and anxiety. Reports irritability. Reports a few outbursts recently - related to moving. Reports improved interest and motivation - getting out and looking for jobs - exploring further education. Reports panic attacks - "working on breathing - doing yoga". Reports decreased worry, rumination and over thinking - "that has gotten a lot better". Reports less obsessive thoughts and acts. Reports getting hyper focused - "more so on tasks". Reports decreased paranoia - "it's a lot better". Reports mood as more stable, but has breakthrough instances". Stating "I feel like I'm seeing improvements". Reports taking medications as prescribed -  Pristiq  100mg  daily and Risperdal  1.5 mg to help with mood symptoms.  Energy levels improved. Active, walking daily. Enjoys some usual interests and activities. Single. Not dating. Lives at home. Spending a few days in [place]. Spending time with family.  Appetite adequate. Weight stable - 125 pounds - 65". Sleeps well most nights. Averages 8 or more hours.  Reports focus and concentration is improving. Reports getting hyper-focused and is working on it by keeping a schedule. Completing tasks. Managing aspects of household. Currently unemployed, but has applied for a few jobs. Denies SI or HI.  Denies AH or VH. Denies self harm. Denies substance use. Denies recent alcohol use - reports drinking wine 3 out of 7 days a week - 2 glasses at a time. Denies THC - delta 8.   Previous medication trials: Effexor , Buspar , Zoloft    Review of Systems:  Review of Systems  Musculoskeletal:  Negative for gait problem.  Neurological:  Negative for tremors.  Psychiatric/Behavioral:         Please refer to HPI    Medications: I have reviewed the patient's current medications.  Current Outpatient Medications  Medication Sig Dispense Refill   busPIRone  (BUSPAR ) 10 MG tablet Take 1 tablet (10 mg total) by mouth 2 (two) times daily. 60 tablet 2   desogestrel -ethinyl estradiol  (ISIBLOOM ) 0.15-30 MG-MCG tablet TAKE 1 TABLET BY MOUTH EVERY DAY 84 tablet 0   desvenlafaxine  (PRISTIQ ) 100 MG 24 hr tablet Take 1 tablet (100 mg total) by mouth daily. 30 tablet 2   risperiDONE  (RISPERDAL ) 1 MG tablet Take one and 1/2 tablets at bedtime. 45 tablet 2   No  current facility-administered medications for this visit.    Medication Side Effects: None  Allergies: No Known Allergies  Past Medical History:  Diagnosis Date   Acne vulgaris    ADHD (attention deficit hyperactivity disorder)    Diaphoresis    Fatigue    Generalized anxiety disorder    h/o panic attacks   Obsessive-compulsive disorder     Family  History  Problem Relation Age of Onset   Anxiety disorder Mother    ADD / ADHD Mother    Depression Mother    Asthma Mother    OCD Father    Hypertension Father    Hypercholesterolemia Father    Anxiety disorder Father    Alcohol abuse Neg Hx    Bipolar disorder Neg Hx    Dementia Neg Hx    Drug abuse Neg Hx    Paranoid behavior Neg Hx    Physical abuse Neg Hx    Schizophrenia Neg Hx    Seizures Neg Hx    Sexual abuse Neg Hx    Cancer Neg Hx    Diabetes Neg Hx    Heart disease Neg Hx     Social History   Socioeconomic History   Marital status: Single    Spouse name: Not on file   Number of children: Not on file   Years of education: 13   Highest education level: Bachelor's degree (e.g., BA, AB, BS)  Occupational History   Occupation: not employed   Occupation: Consulting civil engineer  Tobacco Use   Smoking status: Never   Smokeless tobacco: Never  Vaping Use   Vaping status: Some Days   Substances: Synthetic cannabinoids  Substance and Sexual Activity   Alcohol use: Yes    Alcohol/week: 3.0 standard drinks of alcohol    Types: 3 Standard drinks or equivalent per week    Comment: 3-4 glasses of wine/week   Drug use: Yes    Comment: Delta 8 vape, at the end of the day 4-5 days/week   Sexual activity: Not Currently    Partners: Male    Birth control/protection: Pill    Comment: condoms (with new relationships)  Other Topics Concern   Not on file  Social History Narrative   Graduated from [employer] 01/2022. Majored in Theatre manager.      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      When home, lives with parents.   Older brother lives in [place]      Updated 03/2023   Social Drivers of Health   Financial Resource Strain: Medium Risk (01/08/2024)   Overall Financial Resource Strain (CARDIA)    Difficulty of Paying Living Expenses: Somewhat hard  Food Insecurity: Food Insecurity Present (01/08/2024)    Hunger Vital Sign    Worried About Running Out of Food in the Last Year: Sometimes true    Ran Out of Food in the Last Year: Never true  Transportation Needs: No Transportation Needs (01/08/2024)   PRAPARE - Administrator, Civil Service (Medical): No    Lack of Transportation (Non-Medical): No  Physical Activity: Sufficiently Active (01/08/2024)   Exercise Vital Sign    Days of Exercise per Week: 5 days    Minutes of Exercise per Session: 30 min  Stress: Stress Concern Present (01/08/2024)   [employer] of Occupational Health - Occupational Stress Questionnaire    Feeling of Stress : To some extent  Social Connections: Socially Isolated (01/08/2024)  Social Advertising account executive [NHANES]    Frequency of Communication with Friends and Family: Once a week    Frequency of Social Gatherings with Friends and Family: Once a week    Attends Religious Services: Never    Database administrator or Organizations: No    Attends Banker Meetings: Never    Marital Status: Never married  Intimate Partner Violence: Not At Risk (03/27/2023)   Humiliation, Afraid, Rape, and Kick questionnaire    Fear of Current or Ex-Partner: No    Emotionally Abused: No    Physically Abused: No    Sexually Abused: No    Past Medical History, Surgical history, Social history, and Family history were reviewed and updated as appropriate.   Please see review of systems for further details on the patient's review from today.   Objective:   Physical Exam:  LMP 12/25/2023 Comment: has been having breakthrough bleeding, week before and week after  Physical Exam Constitutional:      General: She is not in acute distress. Musculoskeletal:        General: No deformity.  Neurological:     Mental Status: She is alert and oriented to person, place, and time.     Coordination: Coordination normal.  Psychiatric:        Attention and Perception: Attention and perception normal. She  does not perceive auditory or visual hallucinations.        Mood and Affect: Mood normal. Mood is not anxious or depressed. Affect is not labile, blunt, angry or inappropriate.        Speech: Speech normal.        Behavior: Behavior normal.        Thought Content: Thought content normal. Thought content is not paranoid or delusional. Thought content does not include homicidal or suicidal ideation. Thought content does not include homicidal or suicidal plan.        Cognition and Memory: Cognition and memory normal.        Judgment: Judgment normal.     Comments: Insight intact     Lab Review:     Component Value Date/Time   NA 138 01/11/2024 1213   K 4.9 01/11/2024 1213   CL 103 01/11/2024 1213   CO2 21 01/11/2024 1213   GLUCOSE 92 01/11/2024 1213   BUN 9 01/11/2024 1213   CREATININE 0.83 01/11/2024 1213   CALCIUM 9.8 01/11/2024 1213   PROT 7.2 01/11/2024 1213   ALBUMIN 4.7 01/11/2024 1213   AST 16 01/11/2024 1213   ALT 21 01/11/2024 1213   ALKPHOS 76 01/11/2024 1213   BILITOT 0.3 01/11/2024 1213       Component Value Date/Time   WBC 7.5 01/11/2024 1213   RBC 4.40 01/11/2024 1213   HGB 13.1 01/11/2024 1213   HCT 40.6 01/11/2024 1213   PLT 329 01/11/2024 1213   MCV 92 01/11/2024 1213   MCH 29.8 01/11/2024 1213   MCHC 32.3 01/11/2024 1213   RDW 13.1 01/11/2024 1213   LYMPHSABS 2.0 01/11/2024 1213   EOSABS 0.0 01/11/2024 1213   BASOSABS 0.0 01/11/2024 1213    No results found for: "POCLITH", "LITHIUM"   No results found for: "PHENYTOIN", "PHENOBARB", "VALPROATE", "CBMZ"   .res Assessment: Plan:    Plan:  Risperdal  1.5mg  at bedtime for mood symptoms.    Pristiq  100mg  daily   Add Buspar  10mg  BID  Gene sight testing on file - reviewed for addition of Buspar .  PDMP reviewed  RTC 4  weeks   Therapist - Delmer Ferraris  30 minutes spent dedicated to the care of this patient on the date of this encounter to include pre-visit review of records, ordering of  medication, post visit documentation, and face-to-face time with the patient discussing ADD, GAD, OCD and substance use.  Discussed potential metabolic side effects associated with atypical antipsychotics, as well as potential risk for movement side effects. Advised pt to contact office if movement side effects occur.    Discussed potential benefits, risks, and side effects of stimulants with patient to include increased heart rate, palpitations, insomnia, increased anxiety, increased irritability, or decreased appetite. Instructed patient to contact office if experiencing any significant tolerability issues.   Patient advised to contact office with any questions, adverse effects, or acute worsening in signs and symptoms.  Diagnoses and all orders for this visit:  Generalized anxiety disorder -     busPIRone  (BUSPAR ) 10 MG tablet; Take 1 tablet (10 mg total) by mouth 2 (two) times daily.  Other obsessive-compulsive disorder  Attention deficit hyperactivity disorder (ADHD), combined type, moderate  Substance use     Please see After Visit Summary for patient specific instructions.  Future Appointments  Date Time Provider Department Center  03/21/2024  3:00 PM Anthon Kins [facility] None  03/26/2024  9:00 AM Anthon Kins, [facility] CP[facility] None  04/01/2024  9:00 AM Anthon Kins, [facility] CP-CP None  04/04/2024 11:00 AM Anthon Kins, [facility] CP-CP None  04/09/2024  9:00 AM Anthon Kins, [facility] CP-CP None  04/11/2024 10:00 AM Anthon Kins, [facility] CP-CP None  04/15/2024  1:45 PM Roosvelt Colla, MD P[facility]PF[facility]    No orders of the defined types were placed in this encounter.     -------------------------------

## 2024-02-23 ENCOUNTER — Telehealth: Admitting: Adult Health

## 2024-02-29 ENCOUNTER — Ambulatory Visit (INDEPENDENT_AMBULATORY_CARE_PROVIDER_SITE_OTHER): Admitting: Professional Counselor

## 2024-02-29 ENCOUNTER — Encounter: Payer: Self-pay | Admitting: Professional Counselor

## 2024-02-29 DIAGNOSIS — F4310 Post-traumatic stress disorder, unspecified: Secondary | ICD-10-CM | POA: Diagnosis not present

## 2024-02-29 DIAGNOSIS — F331 Major depressive disorder, recurrent, moderate: Secondary | ICD-10-CM | POA: Diagnosis not present

## 2024-02-29 DIAGNOSIS — F411 Generalized anxiety disorder: Secondary | ICD-10-CM

## 2024-02-29 NOTE — Progress Notes (Signed)
 [facility] Counselor/Therapist Progress Note  Patient ID: Kathryn Blanchard, MRN: [id number],    Date: 04/07/2024  Time Spent: 9:08 AM - 10:10 AM  Treatment Type: Individual Therapy  Reported Symptoms: worries, nervousness, easy startling, intrusive memories, stressful dreams, upset upon trauma cues, physiological reactivity, avoidance, memory loss around trauma, self blame, strong negative feelings and beliefs (low trust, automatic negative thoughts), anhedonia, emotional distancing, irritability, hypervigilance, trouble concentrating, sleep concerns, low mood, fatigue, restlessness  Mental Status Exam:  Appearance:   Casual     Behavior:  Appropriate, Sharing, and Motivated  Motor:  Normal  Speech/Language:   Clear and Coherent and Normal Rate  Affect:  Appropriate and Congruent  Mood:  normal  Thought process:  normal  Thought content:    WNL  Sensory/Perceptual disturbances:    WNL  Orientation:  oriented to person, place, time/date, and situation  Attention:  Good  Concentration:  Good  Memory:  WNL  Fund of knowledge:   Good  Insight:    Good  Judgment:   Good  Impulse Control:  Good   Risk Assessment: Danger to Self:  No Self-injurious Behavior: No Danger to Others: No Duty to Warn:no Physical Aggression / Violence:No  Access to Firearms a concern: No  Gang Involvement:No   Subjective: Patient presented to session to address concerns of anxiety and depression.  She reported progress at this time.  Patient processed the experience of what she considers a recent psychosis and experience of clarity reflecting on the episode, and no longer experiencing paranoid thought process.  She reported her coping skills of somatic therapy, yoga, meditation, daily devotionals, being closer to her faith, and not being on Adderall as helpful.  She processed her hopes and dreams for the future including desire to return to school and options for her studies.  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  She identified focus on 3 children under her care in particular, and an overwhelming concern for their basic needs being met, with a sense that support beyond her control was not adequate.  Counselor and patient discussed patient experience and symptomology at length.  Counselor facilitated PCL 5; patient raw score of 44, positive symptoms 42.  Patient identified hypervigilance particularly around the sound of children crying, and feeling triggered upon seeing a video of herself in the job role, and other associative content related to the job.  She identified experience of hypervigilance and startling, and other symptoms as noted above.  Counselor actively listened, affirmed patient feelings and experience, helped facilitate insight, and helped to assist with coping strategies.  Counselor and patient discussed continuing to address patient's symptoms and treatment plan upon next session.  Counselor and patient discussed patient limiting alcohol intake given implications regarding medication and symptomology.  Interventions: Humanistic/Existential, Insight-Oriented, and Assessment, Treatment Planning, Psychoeducation  Diagnosis:   ICD-10-CM   1. Generalized anxiety disorder  F41.1     2. Major depressive disorder, recurrent episode, moderate (HCC)  F33.1     3. PTSD (post-traumatic stress disorder)  F43.10       Plan: Pt is scheduled for a follow-up; continue to build rapport, discuss symptoms and hx, discuss treatment plan and obtain consent.  STG for patient to limit exposure to triggering content, and avoid drinking alcohol; continue proactive, healthy coping skills.  [name], [facility]

## 2024-03-03 ENCOUNTER — Other Ambulatory Visit: Payer: Self-pay | Admitting: Family Medicine

## 2024-03-03 DIAGNOSIS — N920 Excessive and frequent menstruation with regular cycle: Secondary | ICD-10-CM

## 2024-03-03 DIAGNOSIS — L700 Acne vulgaris: Secondary | ICD-10-CM

## 2024-03-04 ENCOUNTER — Other Ambulatory Visit: Payer: Self-pay | Admitting: Adult Health

## 2024-03-04 DIAGNOSIS — F411 Generalized anxiety disorder: Secondary | ICD-10-CM

## 2024-03-04 NOTE — Telephone Encounter (Signed)
 Patient has CPE scheduled for 04/15/24, is this okay to refill?

## 2024-03-08 ENCOUNTER — Ambulatory Visit (INDEPENDENT_AMBULATORY_CARE_PROVIDER_SITE_OTHER): Admitting: Professional Counselor

## 2024-03-08 ENCOUNTER — Encounter: Payer: Self-pay | Admitting: Professional Counselor

## 2024-03-08 DIAGNOSIS — F331 Major depressive disorder, recurrent, moderate: Secondary | ICD-10-CM

## 2024-03-08 DIAGNOSIS — F411 Generalized anxiety disorder: Secondary | ICD-10-CM | POA: Diagnosis not present

## 2024-03-08 NOTE — Progress Notes (Signed)
 [facility] Counselor/Therapist Progress Note  Patient ID: Kathryn Blanchard, MRN: [id number],    Date: 04/16/2024  Time Spent: 1:06 PM - 2:10 PM   Treatment Type: Individual Therapy  Reported Symptoms: sense of overwhelm, worries, restlessness, frustration, anger, crying spells, stress, low mood, anhedonia, motivational concerns, fatigue  Mental Status Exam:  Appearance:   Casual     Behavior:  Appropriate, Sharing, and Motivated  Motor:  Normal  Speech/Language:   Clear and Coherent and Normal Rate  Affect:  Appropriate and Congruent  Mood:  normal  Thought process:  normal  Thought content:    WNL  Sensory/Perceptual disturbances:    WNL  Orientation:  oriented to person, place, time/date, and situation  Attention:  Good  Concentration:  Good  Memory:  WNL  Fund of knowledge:   Good  Insight:    Good  Judgment:   Good  Impulse Control:  Good   Risk Assessment: Danger to Self:  No Self-injurious Behavior: No Danger to Others: No Duty to Warn:no Physical Aggression / Violence:No  Access to Firearms a concern: No  Gang Involvement:No   Subjective: Patient presented to session to address concerns of anxiety, depression and trauma response pattern.  She reported mixed progress at this time.  She reported missing routine and structure of the past, and for having sadness around having pause in her life trajectory.  She reported continuing to look for work.  She voiced helpfulness of Adderall in mitigating symptomology of overwhelm.  Counselor and patient continue to discuss patient experience from recent job and related experiences where she has felt a sense of hypervigilance and related trauma response pattern symptomology.  Patient identified exacerbated worry around basic needs of children under her and others care being met, and exacerbated worry that she might be doing something wrong.  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  Patient identified feeling angry frustrated crying, not having a lot of energy and feeling less productive and spite of utilizing yoga reading devotionals and other coping mechanisms, routines tailored to my mental health.  Counselor reinforced patient proactive use of coping skills, and encouraged harm reduction around patient endorsement of alcohol use as with her abstinence from cannabis.  Counselor encourage patient to embrace time of receiving support and taking time and rest after challenging circumstances of recent months.  Patient identified her support system is very helpful to her healing.  She processed experience of her delusional thought process around the time of intake, when her life felt like a life simulation, and even simple things like walking the dog made her preoccupied with thoughts of conspiracies regarding neighbors.  She reported gratitude that she is no longer having any thoughts of this nature.  Counselor celebrated progress with patient.  Patient identified ongoing stress about her parents arguing particularly as relates worry regarding their prior separation.  Counselor and patient discussed patient strengths of empathetic nature and also the need for patient to protect her heart and energies, and discussed rigid porous and healthy boundaries psychoeducation.  Counselor and patient discussed patient reflecting on experience of manic symptomology by history (including excessive cleaning, organization and productivity) to continue to discern diagnoses and treatment plan considerations.  Interventions: Solution-Oriented/Positive Psychology, Humanistic/Existential, Insight-Oriented, and Psychoeducation, Treatment Planning  Diagnosis:   ICD-10-CM   1. Generalized anxiety disorder  F41.1     2. Major depressive disorder,  recurrent episode, moderate (HCC)  F33.1       Plan: Pt is scheduled for a  follow-up; continue to discuss treatment plan, engage in process work and developing coping skills. STG between sessions to pace herself in search for and obtaining of work, having reasonable self expectations; give self grace and practice openness to receiving help from loved ones at this time; take space as needed from overwhelming circumstances; prioritize self care, rest, relaxation, protecting energies and sensitivities; practice harm reduction if not abstinence from substance use; limit over-researching tendencies and exposure to hyperfixation content; reflect on experience of symptoms of mania by hx.  Kathryn Blanchard, [facility]

## 2024-03-12 ENCOUNTER — Telehealth: Admitting: Adult Health

## 2024-03-12 ENCOUNTER — Encounter: Payer: Self-pay | Admitting: Adult Health

## 2024-03-12 DIAGNOSIS — F411 Generalized anxiety disorder: Secondary | ICD-10-CM | POA: Diagnosis not present

## 2024-03-12 DIAGNOSIS — F902 Attention-deficit hyperactivity disorder, combined type: Secondary | ICD-10-CM

## 2024-03-12 DIAGNOSIS — F1990 Other psychoactive substance use, unspecified, uncomplicated: Secondary | ICD-10-CM | POA: Diagnosis not present

## 2024-03-12 DIAGNOSIS — F909 Attention-deficit hyperactivity disorder, unspecified type: Secondary | ICD-10-CM | POA: Diagnosis not present

## 2024-03-12 DIAGNOSIS — F428 Other obsessive-compulsive disorder: Secondary | ICD-10-CM

## 2024-03-12 MED ORDER — AMPHETAMINE-DEXTROAMPHETAMINE 30 MG PO TABS: 30.0000 mg | ORAL_TABLET | Freq: Every day | ORAL | 0 refills | Status: DC

## 2024-03-12 NOTE — Progress Notes (Signed)
 Kathryn Blanchard [id number] 07-Sep-2001 22 y.o.  Virtual Visit via Video Note  I connected with pt @ on 03/12/24 at  9:30 AM EDT by a video enabled telemedicine application and verified that I am speaking with the correct person using two identifiers.   I discussed the limitations of evaluation and management by telemedicine and the availability of in person appointments. The patient expressed understanding and agreed to proceed.  I discussed the assessment and treatment plan with the patient. The patient was provided an opportunity to ask questions and all were answered. The patient agreed with the plan and demonstrated an understanding of the instructions.   The patient was advised to call back or seek an in-person evaluation if the symptoms worsen or if the condition fails to improve as anticipated.  I provided 30 minutes of non-face-to-face time during this encounter.  The patient was located at home.  The provider was located at [facility].   Kathryn LOISE Sayers, NP   Subjective:   Patient ID:  Kathryn Blanchard is a 23 y.o. (DOB 09-04-2001) female.  Chief Complaint: No chief complaint on file.   HPI Kathryn Blanchard presents for follow-up of ADD, GAD, substance use and OCD.  Reports restarting her Adderall since   Describes mood today as ok. Pleasant. Denies tearfulness. Mood symptoms - reports depression and anxiety in the afternoons after Adderall wears off. Denies irritability. Denies recent outbursts. Reports improved interest and motivation - more so on days she takes Adderall. Denies panic attacks. Reports decreased worry, rumination and over thinking. Denies obsessive thoughts and acts. Reports getting less hyper focused. Denies recent paranoia. Reports mood as more stable. Stating I'm feeling back to normal - more like myself. Reports taking medications as prescribed - Pristiq  100mg  daily, Adderall 30mg  daily, Buspar  10mg  BID and Risperdal  1.5 mg to help with mood symptoms.   Energy levels improved - lower when not on Adderall. Active, walking daily. Enjoys some usual interests and activities. Single. Not dating. Lives at home. Spending a few days in [place] every week. Spending time with family.  Appetite adequate. Weight gain - 125 to 132 pounds - 65. Sleeps well most nights. Averages 8 to 9 more hours.  Reports focus and concentration has improved - has restarted Adderall 30mg  daily. Reports getting hyper-focused and is working on it by keeping a schedule. Completing tasks. Managing aspects of household. Helping out with mother's preschool. 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 Denies SI or HI.  Denies AH or VH. Denies self harm. Denies paranoia - completely gone. Denies substance use. Denies recent alcohol use - reports drinking wine 3 to 4 nights out of 7 days a week - 2 glasses at a time. Denies THC - delta 8.   Previous medication trials: Effexor , Buspar , Zoloft     Review of Systems:  Review of Systems  Musculoskeletal:  Negative for gait problem.  Neurological:  Negative for tremors.  Psychiatric/Behavioral:         Please refer to HPI    Medications: I have reviewed the patient's current medications.  Current Outpatient Medications  Medication Sig Dispense Refill   busPIRone  (BUSPAR ) 10 MG tablet Take 1 tablet (10 mg total) by mouth 2 (two) times daily. 60 tablet 2   desvenlafaxine  (PRISTIQ ) 100 MG 24 hr tablet Take 1 tablet (100 mg total) by mouth daily. 30 tablet 2   ISIBLOOM  0.15-30 MG-MCG tablet TAKE 1 TABLET BY MOUTH EVERY DAY 84 tablet 0  risperiDONE  (RISPERDAL ) 1 MG tablet Take one and 1/2 tablets at bedtime. 45 tablet 2   No current facility-administered medications for this visit.    Medication Side Effects: None  Allergies: No Known Allergies  Past Medical History:  Diagnosis Date   Acne vulgaris    ADHD (attention deficit hyperactivity disorder)    Diaphoresis    Fatigue    Generalized anxiety  disorder    h/o panic attacks   Obsessive-compulsive disorder     Family History  Problem Relation Age of Onset   Anxiety disorder Mother    ADD / ADHD Mother    Depression Mother    Asthma Mother    OCD Father    Hypertension Father    Hypercholesterolemia Father    Anxiety disorder Father    Alcohol abuse Neg Hx    Bipolar disorder Neg Hx    Dementia Neg Hx    Drug abuse Neg Hx    Paranoid behavior Neg Hx    Physical abuse Neg Hx    Schizophrenia Neg Hx    Seizures Neg Hx    Sexual abuse Neg Hx    Cancer Neg Hx    Diabetes Neg Hx    Heart disease Neg Hx     Social History   Socioeconomic History   Marital status: Single    Spouse name: Not on file   Number of children: Not on file   Years of education: 13   Highest education level: Bachelor's degree (e.g., BA, AB, BS)  Occupational History   Occupation: not employed   Occupation: Consulting civil engineer  Tobacco Use   Smoking status: Never   Smokeless tobacco: Never  Vaping Use   Vaping status: Some Days   Substances: Synthetic cannabinoids  Substance and Sexual Activity   Alcohol use: Yes    Alcohol/week: 3.0 standard drinks of alcohol    Types: 3 Standard drinks or equivalent per week    Comment: 3-4 glasses of wine/week   Drug use: Yes    Comment: Delta 8 vape, at the end of the day 4-5 days/week   Sexual activity: Not Currently    Partners: Male    Birth control/protection: Pill    Comment: condoms (with new relationships)  Other Topics Concern   Not on file  Social History Narrative   Graduated from [employer] 01/2022. Majored in Theatre manager.      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      When home, lives with parents.   Older brother lives in [place]      Updated 03/2023   Social Drivers of Health   Financial Resource Strain: Medium Risk (01/08/2024)   Overall Financial Resource Strain (CARDIA)    Difficulty of Paying Living  Expenses: Somewhat hard  Food Insecurity: Food Insecurity Present (01/08/2024)   Hunger Vital Sign    Worried About Running Out of Food in the Last Year: Sometimes true    Ran Out of Food in the Last Year: Never true  Transportation Needs: No Transportation Needs (01/08/2024)   PRAPARE - Administrator, Civil Service (Medical): No    Lack of Transportation (Non-Medical): No  Physical Activity: Sufficiently Active (01/08/2024)   Exercise Vital Sign    Days of Exercise per Week: 5 days    Minutes of Exercise per Session: 30 min  Stress: Stress Concern Present (01/08/2024)   [employer] of Occupational Health - Occupational Stress Questionnaire  Feeling of Stress : To some extent  Social Connections: Socially Isolated (01/08/2024)   Social Connection and Isolation Panel    Frequency of Communication with Friends and Family: Once a week    Frequency of Social Gatherings with Friends and Family: Once a week    Attends Religious Services: Never    Database administrator or Organizations: No    Attends Banker Meetings: Never    Marital Status: Never married  Intimate Partner Violence: Not At Risk (03/27/2023)   Humiliation, Afraid, Rape, and Kick questionnaire    Fear of Current or Ex-Partner: No    Emotionally Abused: No    Physically Abused: No    Sexually Abused: No    Past Medical History, Surgical history, Social history, and Family history were reviewed and updated as appropriate.   Please see review of systems for further details on the patient's review from today.   Objective:   Physical Exam:  There were no vitals taken for this visit.  Physical Exam Constitutional:      General: She is not in acute distress.  Musculoskeletal:        General: No deformity.   Neurological:     Mental Status: She is alert and oriented to person, place, and time.     Coordination: Coordination normal.   Psychiatric:        Attention and Perception:  Attention and perception normal. She does not perceive auditory or visual hallucinations.        Mood and Affect: Mood normal. Mood is not anxious or depressed. Affect is not labile, blunt, angry or inappropriate.        Speech: Speech normal.        Behavior: Behavior normal.        Thought Content: Thought content normal. Thought content is not paranoid or delusional. Thought content does not include homicidal or suicidal ideation. Thought content does not include homicidal or suicidal plan.        Cognition and Memory: Cognition and memory normal.        Judgment: Judgment normal.     Comments: Insight intact     Lab Review:     Component Value Date/Time   NA 138 01/11/2024 1213   K 4.9 01/11/2024 1213   CL 103 01/11/2024 1213   CO2 21 01/11/2024 1213   GLUCOSE 92 01/11/2024 1213   BUN 9 01/11/2024 1213   CREATININE 0.83 01/11/2024 1213   CALCIUM 9.8 01/11/2024 1213   PROT 7.2 01/11/2024 1213   ALBUMIN 4.7 01/11/2024 1213   AST 16 01/11/2024 1213   ALT 21 01/11/2024 1213   ALKPHOS 76 01/11/2024 1213   BILITOT 0.3 01/11/2024 1213       Component Value Date/Time   WBC 7.5 01/11/2024 1213   RBC 4.40 01/11/2024 1213   HGB 13.1 01/11/2024 1213   HCT 40.6 01/11/2024 1213   PLT 329 01/11/2024 1213   MCV 92 01/11/2024 1213   MCH 29.8 01/11/2024 1213   MCHC 32.3 01/11/2024 1213   RDW 13.1 01/11/2024 1213   LYMPHSABS 2.0 01/11/2024 1213   EOSABS 0.0 01/11/2024 1213   BASOSABS 0.0 01/11/2024 1213    No results found for: POCLITH, LITHIUM   No results found for: PHENYTOIN, PHENOBARB, VALPROATE, CBMZ   .res Assessment: Plan:    Plan:  Risperdal  1.5mg  at bedtime for mood symptoms.    Pristiq  100mg  daily   Buspar  10mg  BID  Has restarted Adderall 30mg  daily discussed  risk of restarting and advised not to increase dose - plans to split in 1/2 twice daily.  PDMP reviewed  RTC 4 weeks   Therapist - Deane Sprang  30 minutes spent dedicated to the care  of this patient on the date of this encounter to include pre-visit review of records, ordering of medication, post visit documentation, and face-to-face time with the patient discussing ADD, GAD, OCD and substance use.  Discussed potential metabolic side effects associated with atypical antipsychotics, as well as potential risk for movement side effects. Advised pt to contact office if movement side effects occur.    Discussed potential benefits, risks, and side effects of stimulants with patient to include increased heart rate, palpitations, insomnia, increased anxiety, increased irritability, or decreased appetite. Instructed patient to contact office if experiencing any significant tolerability issues.   Patient advised to contact office with any questions, adverse effects, or acute worsening in signs and symptoms.  There are no diagnoses linked to this encounter.   Please see After Visit Summary for patient specific instructions.  Future Appointments  Date Time Provider Department Center  03/12/2024  9:30 AM Suhaas Agena, Kathryn Mattocks, NP CP-CP None  03/21/2024  3:00 PM Sprang Almarie DASEN [facility] CP-CP None  03/26/2024  9:00 AM Sprang Almarie DASEN, [facility] CP-CP None  04/01/2024  9:00 AM Sprang Almarie DASEN, [facility] CP-CP None  04/04/2024 11:00 AM Sprang Almarie DASEN, [facility] CP-CP None  04/09/2024  9:00 AM Sprang Almarie DASEN, [facility] CP-CP None  04/11/2024 10:00 AM Sprang Almarie DASEN, [facility] CP-CP None  04/15/2024  1:45 PM Randol Dawes, MD PFM-PFM [facility]    No orders of the defined types were placed in this encounter.     -------------------------------

## 2024-03-21 ENCOUNTER — Encounter: Payer: Self-pay | Admitting: Professional Counselor

## 2024-03-21 ENCOUNTER — Ambulatory Visit (INDEPENDENT_AMBULATORY_CARE_PROVIDER_SITE_OTHER): Admitting: Professional Counselor

## 2024-03-21 DIAGNOSIS — F411 Generalized anxiety disorder: Secondary | ICD-10-CM

## 2024-03-21 DIAGNOSIS — F331 Major depressive disorder, recurrent, moderate: Secondary | ICD-10-CM | POA: Diagnosis not present

## 2024-03-21 DIAGNOSIS — F902 Attention-deficit hyperactivity disorder, combined type: Secondary | ICD-10-CM

## 2024-03-21 NOTE — Progress Notes (Addendum)
      [facility] Counselor/Therapist Progress Note  Patient ID: Kathryn Blanchard, MRN: [id number],    Date: 7.3.2025  Time Spent: 3:04 PM to 4:16 PM  Treatment Type: Patient with Family  Patient presented to session with her mother.  Reported Symptoms: Tearfulness, sadness, low mood, sleep concerns, worries, intrusive unwanted memories, frustration, nervousness, restlessness, career concerns, phase of life concerns  Mental Status Exam:  Appearance:   Casual     Behavior:  Appropriate, Sharing, and Motivated  Motor:  Normal  Speech/Language:   Clear and Coherent and Normal Rate  Affect:  Appropriate and Congruent  Mood:  normal  Thought process:  normal  Thought content:    WNL  Sensory/Perceptual disturbances:    WNL  Orientation:  oriented to person, place, time/date, and situation  Attention:  Good  Concentration:  Good  Memory:  WNL  Fund of knowledge:   Good  Insight:    Good  Judgment:   Good  Impulse Control:  Good   Risk Assessment: Danger to Self:  No Self-injurious Behavior: No Danger to Others: No Duty to Warn:no Physical Aggression / Violence:No  Access to Firearms a concern: No  Gang Involvement:No   Subjective: Patient presented to session to address concerns of anxiety and depression.  She presented to session with her mother.  She identified having crying spells at night, and feeling more depressed.  She identified feeling better when she is in her own space away from home town and family home, feeling independent and being with her friends.  She reflected on developments in approach to employment, and her options.  Counselor assisted with patient discernment.  She processed experience of recent triggers and her work to remember to be easy on herself.  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  Counselor affirmed patient feelings and experience and helped patient reflect on triggers and ways  to nurture herself protection such as limiting exposure.  Patient identified worry that she is a burden to her family at this time, and patient parent reassured patient.  Patient identified coping skills of daily devotions and exercise, yoga, cooking for self and reaching out to her support system, and distracting techniques as helpful in mitigating symptomology.  Counselor and patient discussed patient treatment plan with patient parent present, per patient consent, and patient gave her consent to treatment plan.  Interventions: Solution-Oriented/Positive Psychology, Humanistic/Existential, Insight-Oriented, and Treatment Planning  Diagnosis:   ICD-10-CM   1. Generalized anxiety disorder  F41.1     2. Major depressive disorder, recurrent episode, moderate (HCC)  F33.1        Plan: Patient is scheduled for follow-up; continue process work and developing coping skills.  STG between sessions for patient to continue to discern regarding job opportunities that best align with holistic needs particularly emotional wellbeing at this time, and at an unrushed pace.  Patient to be mindful of continuing to limit exposure to triggering content, and to practice self compassion, being comfortable receiving support from loved ones, noticing precipitation of anxious and depressed states and interrupt with distractions such as cooking, yoga, workouts, walks, being with support persons, and devotions.  Continue to track symptoms and medicinal side effects and efficacy.  [name], [facility]

## 2024-03-26 ENCOUNTER — Encounter: Payer: Self-pay | Admitting: Professional Counselor

## 2024-03-26 ENCOUNTER — Ambulatory Visit (INDEPENDENT_AMBULATORY_CARE_PROVIDER_SITE_OTHER): Admitting: Professional Counselor

## 2024-03-26 DIAGNOSIS — F411 Generalized anxiety disorder: Secondary | ICD-10-CM | POA: Diagnosis not present

## 2024-03-26 DIAGNOSIS — F331 Major depressive disorder, recurrent, moderate: Secondary | ICD-10-CM | POA: Diagnosis not present

## 2024-03-26 DIAGNOSIS — F4312 Post-traumatic stress disorder, chronic: Secondary | ICD-10-CM

## 2024-03-26 NOTE — Progress Notes (Signed)
      [facility] Counselor/Therapist Progress Note  Patient ID: Kathryn Blanchard, MRN: [id number],    Date: 05/02/2024  Time Spent: 9:14 AM - 1012 AM   Treatment Type: Individual Therapy  Reported Symptoms: anxiousness, worries, trouble relaxing, fatigue, intermittent low mood, sadness, stress, career concerns, traumatic memories and impact/response pattern (hypervigilance, fears, preoccupying thoughts, automatic negative beliefs)  Mental Status Exam:  Appearance:   Casual     Behavior:  Appropriate, Sharing, and Motivated  Motor:  Normal  Speech/Language:   Clear and Coherent and Normal Rate  Affect:  Appropriate and Congruent  Mood:  normal  Thought process:  normal  Thought content:    WNL  Sensory/Perceptual disturbances:    WNL  Orientation:  oriented to person, place, time/date, and situation  Attention:  Good  Concentration:  Good  Memory:  WNL  Fund of knowledge:   Good  Insight:    Good  Judgment:   Good  Impulse Control:  Good   Risk Assessment: Danger to Self:  No Self-injurious Behavior: No Danger to Others: No Duty to Warn:no Physical Aggression / Violence:No  Access to Firearms a concern: No  Gang Involvement:No   Subjective: Patient presented to session to address concerns of anxiety and depression, and trauma.  Patient reported mixed progress at this time.  Patient shared regarding significant trauma history that she and counselor had yet to discuss.  Patient shared regarding trauma that involved her family including extended family, and was extremely impactful and significant to patient's personal and familial wellbeing.  She identified ways in which this trauma history has led her to have triggers around computer use as with a past peer, and around the care for children.  She also identified how her parents separation occurred in close proximity to this familial trauma.  Counselor actively listened, held space for patient process work, affirmed patient feelings  and experience, helped patient to resource her sense of her strengths including resiliency, and helped patient to trace the impact of past trauma with more recent trauma and her experience of delusions.  Counselor also helped patient identify posttraumatic growth and healing as relates close family bonds as a result of familial trauma.  Counselor and patient discussed the complexity of resonance with patient experiences, and counselor helped facilitate insight into patient protecting herself and her choice of work going forward, to limit potential triggers.  Patient identified intention to seek work that feels supportive and not activating at this time.  Counselor reinforced patient self resourcing and care.  Interventions: Humanistic/Existential and Insight-Oriented  Diagnosis:   ICD-10-CM   1. Post-traumatic stress disorder, chronic  F43.12     2. Generalized anxiety disorder  F41.1     3. Major depressive disorder, recurrent episode, moderate (HCC)  F33.1       Plan: Pt is scheduled for a follow-up; continue process work and developing coping skills; continue to discuss reflections and revision of treatment plan to include trauma goal. STG between sessions to continue to pace self where personal goals are concerned, prioritize self care and protection around overwhelm.  [name], [facility]

## 2024-04-01 ENCOUNTER — Ambulatory Visit (INDEPENDENT_AMBULATORY_CARE_PROVIDER_SITE_OTHER): Admitting: Professional Counselor

## 2024-04-01 ENCOUNTER — Encounter: Payer: Self-pay | Admitting: Professional Counselor

## 2024-04-01 DIAGNOSIS — F331 Major depressive disorder, recurrent, moderate: Secondary | ICD-10-CM | POA: Diagnosis not present

## 2024-04-01 DIAGNOSIS — F411 Generalized anxiety disorder: Secondary | ICD-10-CM

## 2024-04-01 DIAGNOSIS — F4312 Post-traumatic stress disorder, chronic: Secondary | ICD-10-CM

## 2024-04-01 NOTE — Progress Notes (Signed)
      [facility] Counselor/Therapist Progress Note  Patient ID: Kathryn Blanchard, MRN: [id number],    Date: 05/14/2024  Time Spent: 9:14 AM - 10:13 AM   Treatment Type: Individual Therapy  Reported Symptoms: low mood, fatigue, sadness, worries, preoccupying thoughts, anhedonia, anxiousness  Mental Status Exam:  Appearance:   Casual     Behavior:  Appropriate, Sharing, and Motivated  Motor:  Normal  Speech/Language:   Clear and Coherent and Normal Rate  Affect:  Appropriate and Congruent  Mood:  normal  Thought process:  normal  Thought content:    WNL  Sensory/Perceptual disturbances:    WNL  Orientation:  oriented to person, place, time/date, and situation  Attention:  Good  Concentration:  Good  Memory:  WNL  Fund of knowledge:   Good  Insight:    Good  Judgment:   Good  Impulse Control:  Good   Risk Assessment: Danger to Self:  No Self-injurious Behavior: No Danger to Others: No Duty to Warn:no Physical Aggression / Violence:No  Access to Firearms a concern: No  Gang Involvement:No   Subjective: Patient presented to session to address concerns of trauma response pattern, anxiety and depression.  She reported minimal progress at this time.  She reported experience of medications not being helpful at this time, and processed the experience of going back and forth from [place] to [place] from independent living to being with her family that she feels is stressful due to family dynamics and circumstances.  Patient processed the experience of family history including as relates trauma impact and boundary concerns.  Counselor actively listened, affirmed patient feelings and experience, and utilized CBT and DBT interventions to assist patient in processing trauma history, resourcing self compassion, and building capacity to be mindful around porous boundaries.  Counselor and patient also discussed patient career concerns, and counselor helped facilitate insight around patient  passion for child welfare as supplying motivation as she seeks roles that are more administrative within the field at this time.  Interventions: Cognitive Behavioral Therapy, Dialectical Behavioral Therapy, Solution-Oriented/Positive Psychology, Humanistic/Existential, and Insight-Oriented  Diagnosis:   ICD-10-CM   1. Post-traumatic stress disorder, chronic  F43.12     2. Generalized anxiety disorder  F41.1     3. Major depressive disorder, recurrent episode, moderate (HCC)  F33.1       Plan: Pt is scheduled for a follow-up; continue process work and developing coping skills. STG between sessions to practice self compassion and honoring of feelings with mindfulness in utilizing healthy coping strategies such as support system outreach and distractions such as walks to move through challenging mood states; explore job opportunities that limit trauma cue resonance.  [name], [facility]

## 2024-04-04 ENCOUNTER — Ambulatory Visit: Admitting: Professional Counselor

## 2024-04-09 ENCOUNTER — Encounter: Payer: Self-pay | Admitting: Professional Counselor

## 2024-04-09 ENCOUNTER — Ambulatory Visit (INDEPENDENT_AMBULATORY_CARE_PROVIDER_SITE_OTHER): Admitting: Professional Counselor

## 2024-04-09 DIAGNOSIS — F4312 Post-traumatic stress disorder, chronic: Secondary | ICD-10-CM

## 2024-04-09 DIAGNOSIS — F411 Generalized anxiety disorder: Secondary | ICD-10-CM | POA: Diagnosis not present

## 2024-04-09 DIAGNOSIS — F331 Major depressive disorder, recurrent, moderate: Secondary | ICD-10-CM | POA: Diagnosis not present

## 2024-04-09 NOTE — Progress Notes (Signed)
      [facility] Counselor/Therapist Progress Note  Patient ID: Kathryn Blanchard, MRN: [id number],    Date: 05/20/2024  Time Spent: 9:10 AM to 10:08 AM  Treatment Type: Individual Therapy  Reported Symptoms: Stress, grief/loss, sadness, low mood, worries, tearfulness, interpersonal concerns, phase of life concerns, anxiousness, nervousness, fatigue, trauma response patterns (hypervigilance, traumatic memories, distress upon cues, and other)  Mental Status Exam:  Appearance:   Casual     Behavior:  Appropriate, Sharing, and Motivated  Motor:  Normal  Speech/Language:   Clear and Coherent and Normal Rate  Affect:  Appropriate and Congruent  Mood:  normal  Thought process:  normal  Thought content:    WNL  Sensory/Perceptual disturbances:    WNL  Orientation:  oriented to person, place, time/date, and situation  Attention:  Good  Concentration:  Good  Memory:  WNL  Fund of knowledge:   Good  Insight:    Good  Judgment:   Good  Impulse Control:  Good   Risk Assessment: Danger to Self:  No Self-injurious Behavior: No Danger to Others: No Duty to Warn:no Physical Aggression / Violence:No  Access to Firearms a concern: No  Gang Involvement:No   Subjective: Patient presented to session to address concerns of trauma response pattern, anxiety and depression.  Patient reported mixed progress at this time.  Patient processed experience of missing a particular child she worked with at former job, and shared regarding their relationship including special bond.  She processed experience of overcompensating for coworkers in this role, and reflected on how she and her mother try to maintain control across spheres of life related to trauma history.  Counselor helped facilitate insight and offered psychoeducation around trauma and loss of power and experience of disconnection, and resulting potential for trauma response pattern; they discussed chronicity of symptomology and difficulty for patient to  reflect on series of crises by history during the time of them unfolding, and current processing and reflection as helpful.  Patient also processed experience of her parents separating by history, and experience of attachment to and exacerbated worry of pets, that contributed to trauma resonance.  She reflected on traumatic events per parents had experienced outside of the singular family trauma by history, including a tragic loss for her father.  Counselor and patient also discussed patient continued career exploration, and counselor reinforced patient interest in roles more likely to limit experience of emotional overwhelm at this time.  Interventions: Solution-Oriented/Positive Psychology, Humanistic/Existential, Psycho-education/Bibliotherapy, and Insight-Oriented  Diagnosis:   ICD-10-CM   1. Post-traumatic stress disorder, chronic  F43.12     2. Major depressive disorder, recurrent episode, moderate (HCC)  F33.1     3. Generalized anxiety disorder  F41.1       Plan: Patient is scheduled for follow-up; continue process work and developing coping skills.  STG between sessions for patient to continue to apply for jobs, considering roles amplifying interpersonal skills including hotel front desk, recruiting, or hostessing.  Continue to resource healthy coping skills and opportunities including meditation practice, yoga and exercise, limiting exposure to triggering content, engagement with supportive persons.  [name], [facility]

## 2024-04-10 ENCOUNTER — Encounter: Payer: Self-pay | Admitting: Adult Health

## 2024-04-10 ENCOUNTER — Telehealth: Admitting: Adult Health

## 2024-04-10 DIAGNOSIS — F902 Attention-deficit hyperactivity disorder, combined type: Secondary | ICD-10-CM

## 2024-04-10 DIAGNOSIS — F429 Obsessive-compulsive disorder, unspecified: Secondary | ICD-10-CM | POA: Diagnosis not present

## 2024-04-10 DIAGNOSIS — F909 Attention-deficit hyperactivity disorder, unspecified type: Secondary | ICD-10-CM

## 2024-04-10 DIAGNOSIS — F1990 Other psychoactive substance use, unspecified, uncomplicated: Secondary | ICD-10-CM | POA: Diagnosis not present

## 2024-04-10 DIAGNOSIS — F411 Generalized anxiety disorder: Secondary | ICD-10-CM

## 2024-04-10 DIAGNOSIS — F428 Other obsessive-compulsive disorder: Secondary | ICD-10-CM

## 2024-04-10 MED ORDER — AMPHETAMINE-DEXTROAMPHETAMINE 30 MG PO TABS: 30.0000 mg | ORAL_TABLET | Freq: Every day | ORAL | 0 refills | Status: DC

## 2024-04-10 MED ORDER — RISPERIDONE 1 MG PO TABS: ORAL_TABLET | ORAL | 2 refills | Status: DC

## 2024-04-10 MED ORDER — DESVENLAFAXINE SUCCINATE ER 100 MG PO TB24: 100.0000 mg | ORAL_TABLET | Freq: Every day | ORAL | 2 refills | Status: DC

## 2024-04-10 MED ORDER — BUSPIRONE HCL 10 MG PO TABS: 10.0000 mg | ORAL_TABLET | Freq: Two times a day (BID) | ORAL | 2 refills | Status: DC

## 2024-04-10 NOTE — Progress Notes (Signed)
 Kathryn Blanchard [id number] Jan 08, 2001 23 y.o.  Virtual Visit via Video Note  I connected with pt @ on 04/10/24 at  9:30 AM EDT by a video enabled telemedicine application and verified that I am speaking with the correct person using two identifiers.   I discussed the limitations of evaluation and management by telemedicine and the availability of in person appointments. The patient expressed understanding and agreed to proceed.  I discussed the assessment and treatment plan with the patient. The patient was provided an opportunity to ask questions and all were answered. The patient agreed with the plan and demonstrated an understanding of the instructions.   The patient was advised to call back or seek an in-person evaluation if the symptoms worsen or if the condition fails to improve as anticipated.  I provided 25 minutes of non-face-to-face time during this encounter.  The patient was located at home.  The provider was located at [facility].   Kathryn LOISE Sayers, NP   Subjective:   Patient ID:  Kathryn Blanchard is a 23 y.o. (DOB [birth year]) female.  Chief Complaint: No chief complaint on file.   HPI Kathryn Blanchard presents for follow-up of ADD, GAD, substance use and OCD.  Describes mood today as ok. Pleasant. Denies tearfulness. Mood symptoms - reports some situational depression and anxiety. Reports improved interest and motivation - pretty stable. Denies irritability. Denies recent outbursts. Denies panic attacks. Denies worry, rumination and over thinking. Denies obsessive thoughts and acts. Denies getting less hyper focused. Denies recent paranoia. Reports mood as more stable. Stating I feel like I'm doing better. Reports taking medications as prescribed - Pristiq  100mg  daily, Adderall 30mg  daily, Buspar  10mg  BID and Risperdal  1.5 mg to help with mood symptoms.  Energy levels improved. Active, walking daily. Enjoys some usual interests and activities. Single. Not dating.  Lives at home. Spending a few days in [place] every week. Spending time with family.  Appetite adequate. Weight gain - 132 to 138 pounds - 65. Reports she had lost a lot of weight - stress - depression. Sleeps well most nights. Averages 8 to 9 more hours.  Reports focus and concentration has been a lot better - has restarted Adderall 30mg  daily - 1/2 tablet twice daily. Reports less hyper focusing - maintaining a schedule. Completing tasks. Managing aspects of household. Helping out with mother's preschool. Currently unemployed, but looking for a job. Denies SI or HI.  Denies AH or VH. Denies self harm. Denies paranoia. Denies substance use. Reports alcohol use - reports drinking 2 glasses of wine 2 to 3 days a week. Denies THC - delta 8.   Previous medication trials: Effexor , Buspar , Zoloft     Review of Systems:  Review of Systems  Musculoskeletal:  Negative for gait problem.  Neurological:  Negative for tremors.  Psychiatric/Behavioral:         Please refer to HPI    Medications: I have reviewed the patient's current medications.  Current Outpatient Medications  Medication Sig Dispense Refill   amphetamine -dextroamphetamine  (ADDERALL) 30 MG tablet Take 1 tablet by mouth daily. 30 tablet 0   busPIRone  (BUSPAR ) 10 MG tablet Take 1 tablet (10 mg total) by mouth 2 (two) times daily. 60 tablet 2   desvenlafaxine  (PRISTIQ ) 100 MG 24 hr tablet Take 1 tablet (100 mg total) by mouth daily. 30 tablet 2   ISIBLOOM  0.15-30 MG-MCG tablet TAKE 1 TABLET BY MOUTH EVERY DAY 84 tablet 0   risperiDONE  (RISPERDAL ) 1 MG tablet Take one and  1/2 tablets at bedtime. 45 tablet 2   No current facility-administered medications for this visit.    Medication Side Effects: None  Allergies: No Known Allergies  Past Medical History:  Diagnosis Date   Acne vulgaris    ADHD (attention deficit hyperactivity disorder)    Diaphoresis    Fatigue    Generalized anxiety disorder    h/o panic attacks    Obsessive-compulsive disorder     Family History  Problem Relation Age of Onset   Anxiety disorder Mother    ADD / ADHD Mother    Depression Mother    Asthma Mother    OCD Father    Hypertension Father    Hypercholesterolemia Father    Anxiety disorder Father    Alcohol abuse Neg Hx    Bipolar disorder Neg Hx    Dementia Neg Hx    Drug abuse Neg Hx    Paranoid behavior Neg Hx    Physical abuse Neg Hx    Schizophrenia Neg Hx    Seizures Neg Hx    Sexual abuse Neg Hx    Cancer Neg Hx    Diabetes Neg Hx    Heart disease Neg Hx     Social History   Socioeconomic History   Marital status: Single    Spouse name: Not on file   Number of children: Not on file   Years of education: 13   Highest education level: Bachelor's degree (e.g., BA, AB, BS)  Occupational History   Occupation: not employed   Occupation: Consulting civil engineer  Tobacco Use   Smoking status: Never   Smokeless tobacco: Never  Vaping Use   Vaping status: Some Days   Substances: Synthetic cannabinoids  Substance and Sexual Activity   Alcohol use: Yes    Alcohol/week: 3.0 standard drinks of alcohol    Types: 3 Standard drinks or equivalent per week    Comment: 3-4 glasses of wine/week   Drug use: Yes    Comment: Delta 8 vape, at the end of the day 4-5 days/week   Sexual activity: Not Currently    Partners: Male    Birth control/protection: Pill    Comment: condoms (with new relationships)  Other Topics Concern   Not on file  Social History Narrative   Graduated from [employer] 01/2022. Majored in Theatre manager.      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      When home, lives with parents.   Older brother lives in [place]      Updated 03/2023   Social Drivers of Health   Financial Resource Strain: Medium Risk (01/08/2024)   Overall Financial Resource Strain (CARDIA)    Difficulty of Paying Living Expenses: Somewhat hard  Food  Insecurity: Food Insecurity Present (01/08/2024)   Hunger Vital Sign    Worried About Running Out of Food in the Last Year: Sometimes true    Ran Out of Food in the Last Year: Never true  Transportation Needs: No Transportation Needs (01/08/2024)   PRAPARE - Administrator, Civil Service (Medical): No    Lack of Transportation (Non-Medical): No  Physical Activity: Sufficiently Active (01/08/2024)   Exercise Vital Sign    Days of Exercise per Week: 5 days    Minutes of Exercise per Session: 30 min  Stress: Stress Concern Present (01/08/2024)   [employer] of Occupational Health - Occupational Stress Questionnaire    Feeling of Stress : To  some extent  Social Connections: Socially Isolated (01/08/2024)   Social Connection and Isolation Panel    Frequency of Communication with Friends and Family: Once a week    Frequency of Social Gatherings with Friends and Family: Once a week    Attends Religious Services: Never    Database administrator or Organizations: No    Attends Banker Meetings: Never    Marital Status: Never married  Intimate Partner Violence: Not At Risk (03/27/2023)   Humiliation, Afraid, Rape, and Kick questionnaire    Fear of Current or Ex-Partner: No    Emotionally Abused: No    Physically Abused: No    Sexually Abused: No    Past Medical History, Surgical history, Social history, and Family history were reviewed and updated as appropriate.   Please see review of systems for further details on the patient's review from today.   Objective:   Physical Exam:  There were no vitals taken for this visit.  Physical Exam Constitutional:      General: She is not in acute distress. Musculoskeletal:        General: No deformity.  Neurological:     Mental Status: She is alert and oriented to person, place, and time.     Coordination: Coordination normal.  Psychiatric:        Attention and Perception: Attention and perception normal. She does  not perceive auditory or visual hallucinations.        Mood and Affect: Mood normal. Mood is not anxious or depressed. Affect is not labile, blunt, angry or inappropriate.        Speech: Speech normal.        Behavior: Behavior normal.        Thought Content: Thought content normal. Thought content is not paranoid or delusional. Thought content does not include homicidal or suicidal ideation. Thought content does not include homicidal or suicidal plan.        Cognition and Memory: Cognition and memory normal.        Judgment: Judgment normal.     Comments: Insight intact     Lab Review:     Component Value Date/Time   NA 138 01/11/2024 1213   K 4.9 01/11/2024 1213   CL 103 01/11/2024 1213   CO2 21 01/11/2024 1213   GLUCOSE 92 01/11/2024 1213   BUN 9 01/11/2024 1213   CREATININE 0.83 01/11/2024 1213   CALCIUM 9.8 01/11/2024 1213   PROT 7.2 01/11/2024 1213   ALBUMIN 4.7 01/11/2024 1213   AST 16 01/11/2024 1213   ALT 21 01/11/2024 1213   ALKPHOS 76 01/11/2024 1213   BILITOT 0.3 01/11/2024 1213       Component Value Date/Time   WBC 7.5 01/11/2024 1213   RBC 4.40 01/11/2024 1213   HGB 13.1 01/11/2024 1213   HCT 40.6 01/11/2024 1213   PLT 329 01/11/2024 1213   MCV 92 01/11/2024 1213   MCH 29.8 01/11/2024 1213   MCHC 32.3 01/11/2024 1213   RDW 13.1 01/11/2024 1213   LYMPHSABS 2.0 01/11/2024 1213   EOSABS 0.0 01/11/2024 1213   BASOSABS 0.0 01/11/2024 1213    No results found for: POCLITH, LITHIUM   No results found for: PHENYTOIN, PHENOBARB, VALPROATE, CBMZ   .res Assessment: Plan:    Plan:  Risperdal  1.5mg  at bedtime for mood symptoms.    Pristiq  100mg  daily   Buspar  10mg  BID  Adderall 30mg  daily - taking 1/2 twice daily.  PDMP reviewed  RTC 6  weeks   Therapist - Deane Sprang  25 minutes spent dedicated to the care of this patient on the date of this encounter to include pre-visit review of records, ordering of medication, post visit  documentation, and face-to-face time with the patient discussing ADD, GAD, OCD and substance use.  Discussed potential metabolic side effects associated with atypical antipsychotics, as well as potential risk for movement side effects. Advised pt to contact office if movement side effects occur.    Discussed potential benefits, risks, and side effects of stimulants with patient to include increased heart rate, palpitations, insomnia, increased anxiety, increased irritability, or decreased appetite. Instructed patient to contact office if experiencing any significant tolerability issues.   Patient advised to contact office with any questions, adverse effects, or acute worsening in signs and symptoms. There are no diagnoses linked to this encounter.   Please see After Visit Summary for patient specific instructions.  Future Appointments  Date Time Provider Department Center  04/10/2024  9:30 AM Geral Coker, Kathryn Mattocks, NP CP-CP None  04/15/2024  1:45 PM Randol Dawes, MD PFM-PFM [facility]  04/22/2024 11:00 AM Sprang Almarie DASEN, [facility] CP-CP None  05/30/2024 10:00 AM Sprang Almarie DASEN, [facility] CP-CP None  06/06/2024  9:00 AM Sprang Almarie DASEN, [facility] CP-CP None  06/13/2024 11:00 AM Sprang Almarie DASEN, [facility] CP-CP None  06/20/2024  9:00 AM Sprang Almarie DASEN, [facility] CP-CP None  06/27/2024  9:00 AM Sprang Almarie DASEN, [facility] CP-CP None    No orders of the defined types were placed in this encounter.     -------------------------------

## 2024-04-11 ENCOUNTER — Ambulatory Visit: Admitting: Professional Counselor

## 2024-04-12 ENCOUNTER — Telehealth: Payer: Self-pay | Admitting: Adult Health

## 2024-04-12 NOTE — Telephone Encounter (Signed)
 Next visit is 05/21/24. Jayden called and states that the Risperidone  she is taking has made her gain weight, 20 lbs as of now. Please call her at [phone]. She doesn't want to continue weight gain. Her phone number is [phone].  Pharmacy is:   [id number] - [place], [place] - [address] AT [facility] [address] [facility] [address]   Phone: [phone]  Fax: [phone]
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
## 2024-04-13 NOTE — Patient Instructions (Incomplete)
   HEALTH MAINTENANCE RECOMMENDATIONS:  It is recommended that you get at least 30 minutes of aerobic exercise at least 5 days/week (for weight loss, you may need as much as 60-90 minutes). This can be any activity that gets your heart rate up. This can be divided in 10-15 minute intervals if needed, but try and build up your endurance at least once a week.  Weight bearing exercise is also recommended twice weekly.  Eat a healthy diet with lots of vegetables, fruits and fiber.  "Colorful" foods have a lot of vitamins (ie green vegetables, tomatoes, red peppers, etc).  Limit sweet tea, regular sodas and alcoholic beverages, all of which has a lot of calories and sugar.  Up to 1 alcoholic drink daily may be beneficial for women (unless trying to lose weight, watch sugars).  Drink a lot of water.  Calcium recommendations are 1200-1500 mg daily (1500 mg for postmenopausal women or women without ovaries), and vitamin D 1000 IU daily.  This should be obtained from diet and/or supplements (vitamins), and calcium should not be taken all at once, but in divided doses.  Monthly self breast exams and yearly mammograms for women over the age of 109 is recommended.  Sunscreen of at least SPF 30 should be used on all sun-exposed parts of the skin when outside between the hours of 10 am and 4 pm (not just when at beach or pool, but even with exercise, golf, tennis, and yard work!)  Use a sunscreen that says "broad spectrum" so it covers both UVA and UVB rays, and make sure to reapply every 1-2 hours.  Remember to change the batteries in your smoke detectors when changing your clock times in the spring and fall. Carbon monoxide detectors are recommended for your home.  Use your seat belt every time you are in a car, and please drive safely and not be distracted with cell phones and texting while driving.

## 2024-04-15 ENCOUNTER — Ambulatory Visit (INDEPENDENT_AMBULATORY_CARE_PROVIDER_SITE_OTHER): Admitting: Family Medicine

## 2024-04-15 ENCOUNTER — Encounter: Payer: Self-pay | Admitting: Family Medicine

## 2024-04-15 VITALS — BP 110/70 | HR 72 | Ht 66.0 in | Wt 139.4 lb

## 2024-04-15 DIAGNOSIS — Z0000 Encounter for general adult medical examination without abnormal findings: Secondary | ICD-10-CM | POA: Diagnosis not present

## 2024-04-15 DIAGNOSIS — N920 Excessive and frequent menstruation with regular cycle: Secondary | ICD-10-CM

## 2024-04-15 DIAGNOSIS — F902 Attention-deficit hyperactivity disorder, combined type: Secondary | ICD-10-CM

## 2024-04-15 DIAGNOSIS — L700 Acne vulgaris: Secondary | ICD-10-CM

## 2024-04-15 DIAGNOSIS — Z5181 Encounter for therapeutic drug level monitoring: Secondary | ICD-10-CM

## 2024-04-15 DIAGNOSIS — F411 Generalized anxiety disorder: Secondary | ICD-10-CM | POA: Diagnosis not present

## 2024-04-15 MED ORDER — CLINDAMYCIN PHOS-BENZOYL PEROX 1-5 % EX GEL: Freq: Two times a day (BID) | CUTANEOUS | 3 refills | Status: AC

## 2024-04-15 NOTE — Telephone Encounter (Signed)
 Pt seen 7/23: Risperdal  1.5mg  at bedtime for mood symptoms. Pristiq  100mg  daily  Buspar  10mg  BID Adderall 30mg  daily - taking 1/2 twice daily.   RTC 6 weeks   Pt reporting 20 lb wt gain since starting on Risperdal  in April. She reports it did help with her psychosis, no longer having paranoia. Would like to stop the Risperdal .

## 2024-04-15 NOTE — Telephone Encounter (Signed)
 LVM to [facility]

## 2024-04-16 ENCOUNTER — Ambulatory Visit: Payer: Self-pay | Admitting: Family Medicine

## 2024-04-16 ENCOUNTER — Other Ambulatory Visit: Payer: Self-pay | Admitting: Adult Health

## 2024-04-16 DIAGNOSIS — F411 Generalized anxiety disorder: Secondary | ICD-10-CM

## 2024-04-16 DIAGNOSIS — F902 Attention-deficit hyperactivity disorder, combined type: Secondary | ICD-10-CM

## 2024-04-16 DIAGNOSIS — F428 Other obsessive-compulsive disorder: Secondary | ICD-10-CM

## 2024-04-16 LAB — VITAMIN D 25 HYDROXY (VIT D DEFICIENCY, FRACTURES): Vit D, 25-Hydroxy: 37.2 ng/mL (ref 30.0–100.0)

## 2024-04-16 LAB — TSH: TSH: 0.972 u[IU]/mL (ref 0.450–4.500)

## 2024-04-16 LAB — VITAMIN B12: Vitamin B-12: 612 pg/mL (ref 232–1245)

## 2024-04-16 NOTE — Telephone Encounter (Signed)
 Please call to schedule appt to discuss medication issues per [place].

## 2024-04-22 ENCOUNTER — Encounter: Payer: Self-pay | Admitting: Professional Counselor

## 2024-04-22 ENCOUNTER — Ambulatory Visit (INDEPENDENT_AMBULATORY_CARE_PROVIDER_SITE_OTHER): Admitting: Professional Counselor

## 2024-04-22 DIAGNOSIS — F331 Major depressive disorder, recurrent, moderate: Secondary | ICD-10-CM | POA: Diagnosis not present

## 2024-04-22 DIAGNOSIS — F411 Generalized anxiety disorder: Secondary | ICD-10-CM | POA: Diagnosis not present

## 2024-04-22 DIAGNOSIS — F4312 Post-traumatic stress disorder, chronic: Secondary | ICD-10-CM

## 2024-04-22 NOTE — Progress Notes (Signed)
      [facility] Counselor/Therapist Progress Note  Patient ID: Kathryn Blanchard, MRN: [id number],    Date: 05/25/2024  Time Spent: 11:14 AM - 12:36 PM   Treatment Type: Individual Therapy  Reported Symptoms: sense of overwhelm, distractibility, trouble relaxing, low mood, excess sleep, stress, interpersonal concerns, trauma response pattern including avoidance, reactivity upon cues, trauma memories  Mental Status Exam:  Appearance:   Neat     Behavior:  Appropriate, Sharing, and Motivated  Motor:  Normal  Speech/Language:   Clear and Coherent and Normal Rate  Affect:  Appropriate and Congruent  Mood:  normal  Thought process:  normal  Thought content:    WNL  Sensory/Perceptual disturbances:    WNL  Orientation:  oriented to person, place, time/date, and situation  Attention:  Good  Concentration:  Good  Memory:  WNL  Fund of knowledge:   Good  Insight:    Good  Judgment:   Good  Impulse Control:  Good   Risk Assessment: Danger to Self:  No Self-injurious Behavior: No Danger to Others: No Duty to Warn:no Physical Aggression / Violence:No  Access to Firearms a concern: No  Gang Involvement:No   Subjective: Patient presented to session to address concerns of anxiety, depression and trauma response pattern.  She reported mixed progress at this time.  Patient reported having job interview today for a role that aligns with counselor and patient discussions pertaining to patient holistic needs at this time in prior sessions.  Counselor encouraged patient and assisted patient in interview preparation.  Patient reported to be walking, cooking and taking an exercise class, and to notice being in a better place emotionally/mentally when in [place] versus home with parents.  Counselor reinforced patient coping skills and helped facilitate insight into patient feelings and experience around independent living and home visits.  Counselor facilitated narrative therapy intervention and patient  identified strong protective factor of family and friends across lifespan, processed early separation anxiety and preschool, experience of parental conflict intermittently, her grandfather stroke, moves of her family, medical experiences, her experience of significant family trauma in high school, and the pandemic, her parents separation, lost pet, travels, and more recent experience of prior employment and experience of psychosis.    Interventions: Solution-Oriented/Positive Psychology, Humanistic/Existential, Narrative, and Insight-Oriented  Diagnosis:   ICD-10-CM   1. Post-traumatic stress disorder, chronic  F43.12     2. Generalized anxiety disorder  F41.1     3. Major depressive disorder, recurrent episode, moderate (HCC)  F33.1       Plan: Pt is scheduled for a follow-up; continue process work and developing coping skills; continue to discuss narrative therapy exercise. STG between sessions for pt to reflect on impact of life events per journal prompts created in therapy via narrative intervention. Continue self care practices including walking, cooking and fitness classes.  Kathryn Blanchard, [facility]

## 2024-05-02 ENCOUNTER — Other Ambulatory Visit: Payer: Self-pay | Admitting: Adult Health

## 2024-05-02 DIAGNOSIS — F428 Other obsessive-compulsive disorder: Secondary | ICD-10-CM

## 2024-05-02 DIAGNOSIS — F411 Generalized anxiety disorder: Secondary | ICD-10-CM

## 2024-05-02 DIAGNOSIS — F902 Attention-deficit hyperactivity disorder, combined type: Secondary | ICD-10-CM

## 2024-05-13 ENCOUNTER — Other Ambulatory Visit: Payer: Self-pay

## 2024-05-13 ENCOUNTER — Telehealth: Payer: Self-pay | Admitting: Adult Health

## 2024-05-13 DIAGNOSIS — F902 Attention-deficit hyperactivity disorder, combined type: Secondary | ICD-10-CM

## 2024-05-13 NOTE — Telephone Encounter (Signed)
 Pt called asking for a refill on her adderall 30 mg. She has moved and has a new pharmacy. [facility]  located at [address] [name]. Please cancel local pharmacy and add this new one

## 2024-05-13 NOTE — Telephone Encounter (Signed)
 Pended

## 2024-05-14 MED ORDER — AMPHETAMINE-DEXTROAMPHETAMINE 30 MG PO TABS: 30.0000 mg | ORAL_TABLET | Freq: Every day | ORAL | 0 refills | Status: DC

## 2024-05-14 NOTE — Telephone Encounter (Signed)
 Pt said she is still taking Risperdal 

## 2024-05-14 NOTE — Telephone Encounter (Signed)
 LVM to [facility]

## 2024-05-21 ENCOUNTER — Telehealth (INDEPENDENT_AMBULATORY_CARE_PROVIDER_SITE_OTHER): Admitting: Adult Health

## 2024-05-21 ENCOUNTER — Encounter: Payer: Self-pay | Admitting: Adult Health

## 2024-05-21 DIAGNOSIS — F1990 Other psychoactive substance use, unspecified, uncomplicated: Secondary | ICD-10-CM

## 2024-05-21 DIAGNOSIS — F909 Attention-deficit hyperactivity disorder, unspecified type: Secondary | ICD-10-CM | POA: Diagnosis not present

## 2024-05-21 DIAGNOSIS — F428 Other obsessive-compulsive disorder: Secondary | ICD-10-CM

## 2024-05-21 DIAGNOSIS — F902 Attention-deficit hyperactivity disorder, combined type: Secondary | ICD-10-CM

## 2024-05-21 DIAGNOSIS — F411 Generalized anxiety disorder: Secondary | ICD-10-CM | POA: Diagnosis not present

## 2024-05-21 MED ORDER — BUSPIRONE HCL 10 MG PO TABS: 10.0000 mg | ORAL_TABLET | Freq: Two times a day (BID) | ORAL | 2 refills | Status: DC

## 2024-05-21 MED ORDER — DESVENLAFAXINE SUCCINATE ER 100 MG PO TB24: 100.0000 mg | ORAL_TABLET | Freq: Every day | ORAL | 2 refills | Status: DC

## 2024-05-21 MED ORDER — RISPERIDONE 1 MG PO TABS
ORAL_TABLET | ORAL | 2 refills | Status: DC
Start: 2024-05-21 — End: 2024-07-02

## 2024-05-21 MED ORDER — AMPHETAMINE-DEXTROAMPHETAMINE 30 MG PO TABS: 30.0000 mg | ORAL_TABLET | Freq: Every day | ORAL | 0 refills | Status: DC

## 2024-05-21 NOTE — Progress Notes (Signed)
 Kathryn Blanchard [id number] 08-Apr-2001 23 y.o.  Virtual Visit via Video Note  I connected with pt @ on 05/21/24 at 10:00 AM EDT by a video enabled telemedicine application and verified that I am speaking with the correct person using two identifiers.   I discussed the limitations of evaluation and management by telemedicine and the availability of in person appointments. The patient expressed understanding and agreed to proceed.  I discussed the assessment and treatment plan with the patient. The patient was provided an opportunity to ask questions and all were answered. The patient agreed with the plan and demonstrated an understanding of the instructions.   The patient was advised to call back or seek an in-person evaluation if the symptoms worsen or if the condition fails to improve as anticipated.  I provided 25 minutes of non-face-to-face time during this encounter.  The patient was located at home.  The provider was located at [facility].   Kathryn LOISE Sayers, NP   Subjective:   Patient ID:  Kathryn Blanchard is a 23 y.o. (DOB [birth year]) female.  Chief Complaint: No chief complaint on file.   HPI Kathryn Blanchard presents for follow-up of ADD, GAD, substance use and OCD.  Describes mood today as ok. Pleasant. Denies tearfulness. Mood symptoms - denies depression and anxiety. Reports improved interest and motivation. Denies irritability. Denies recent outbursts. Denies panic attacks. Denies worry, rumination and over thinking. Denies obsessive thoughts and acts. Denies getting hyper focused. Denies recent paranoia. Reports mood as more stable. Stating I feel like I'm doing pk. Reports taking medications as prescribed - Pristiq  100mg  daily, Adderall 30mg  daily, Buspar  10mg  BID and Risperdal  1.5 mg to help with mood symptoms.  Energy levels improved. Active, has a regular exercise routine. Enjoys some usual interests and activities. Single. Not dating. Lives at home. Staying in  [place] full time. Spending time with family.  Appetite adequate. Weight gain - 140 pounds - 66.  Sleeps well most nights. Averages 8 to 9 more hours.  Reports focus and concentration is better - restarted Adderall 30mg  daily - 1/2 tablet twice daily. Completing tasks. Managing aspects of household. Currently unemployed, but looking for a job. Denies SI or HI.  Denies AH or VH. Denies self harm. Denies paranoia. Denies substance use. Reports alcohol use - reports drinking 2 glasses of wine 2 to 3 days a week. Denies THC - delta 8.   Previous medication trials: Effexor , Buspar , Zoloft    Review of Systems:  Review of Systems  Musculoskeletal:  Negative for gait problem.  Neurological:  Negative for tremors.  Psychiatric/Behavioral:         Please refer to HPI    Medications: I have reviewed the patient's current medications.  Current Outpatient Medications  Medication Sig Dispense Refill   amphetamine -dextroamphetamine  (ADDERALL) 30 MG tablet Take 1 tablet by mouth daily. 30 tablet 0   busPIRone  (BUSPAR ) 10 MG tablet Take 1 tablet (10 mg total) by mouth 2 (two) times daily. 60 tablet 2   clindamycin -benzoyl peroxide (BENZACLIN) gel Apply topically 2 (two) times daily. 50 g 3   desvenlafaxine  (PRISTIQ ) 100 MG 24 hr tablet Take 1 tablet (100 mg total) by mouth daily. 30 tablet 2   ISIBLOOM  0.15-30 MG-MCG tablet TAKE 1 TABLET BY MOUTH EVERY DAY 84 tablet 0   risperiDONE  (RISPERDAL ) 1 MG tablet Take one and 1/2 tablets at bedtime. 45 tablet 2   No current facility-administered medications for this visit.    Medication Side Effects: None  Allergies: No Known Allergies  Past Medical History:  Diagnosis Date   Acne vulgaris    ADHD (attention deficit hyperactivity disorder)    Diaphoresis    Fatigue    Generalized anxiety disorder    h/o panic attacks   Obsessive-compulsive disorder     Family History  Problem Relation Age of Onset   Anxiety disorder Mother    ADD / ADHD  Mother    Depression Mother    Asthma Mother    OCD Father    Hypertension Father    Hypercholesterolemia Father    Anxiety disorder Father    Stroke Paternal Grandfather        possibly in his 46's   Alcohol abuse Neg Hx    Bipolar disorder Neg Hx    Dementia Neg Hx    Drug abuse Neg Hx    Paranoid behavior Neg Hx    Physical abuse Neg Hx    Schizophrenia Neg Hx    Seizures Neg Hx    Sexual abuse Neg Hx    Cancer Neg Hx    Diabetes Neg Hx    Heart disease Neg Hx     Social History   Socioeconomic History   Marital status: Single    Spouse name: Not on file   Number of children: Not on file   Years of education: 13   Highest education level: Bachelor's degree (e.g., BA, AB, BS)  Occupational History   Occupation: not employed   Occupation: Consulting civil engineer  Tobacco Use   Smoking status: Never   Smokeless tobacco: Never  Vaping Use   Vaping status: Some Days   Substances: Synthetic cannabinoids  Substance and Sexual Activity   Alcohol use: Not Currently    Alcohol/week: 5.0 - 6.0 standard drinks of alcohol    Types: 5 - 6 Glasses of wine per week    Comment: 1-2 glasses of wine 3 days/week   Drug use: Not Currently    Comment: Delta 8 vape --none since 12/2023   Sexual activity: Not Currently    Partners: Male    Birth control/protection: Pill    Comment: condoms (with new relationships)  Other Topics Concern   Not on file  Social History Narrative   Graduated from [employer] 01/2022. Majored in Theatre manager.      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      Currently living either at home with parents, or at her apartment in [place].   She is currently searching for a job.   Older brother lives in [place]      Updated 03/2024   Social Drivers of Health   Financial Resource Strain: Low Risk  (04/14/2024)   Overall Financial Resource Strain (CARDIA)    Difficulty of Paying Living Expenses:  Not very hard  Food Insecurity: No Food Insecurity (04/14/2024)   Hunger Vital Sign    Worried About Running Out of Food in the Last Year: Never true    Ran Out of Food in the Last Year: Never true  Transportation Needs: No Transportation Needs (04/14/2024)   PRAPARE - Administrator, Civil Service (Medical): No    Lack of Transportation (Non-Medical): No  Physical Activity: Sufficiently Active (04/14/2024)   Exercise Vital Sign    Days of Exercise per Week: 4 days    Minutes of Exercise per Session: 50 min  Stress: No Stress Concern Present (04/14/2024)   [employer] of  Occupational Health - Occupational Stress Questionnaire    Feeling of Stress: Only a little  Social Connections: Moderately Isolated (04/14/2024)   Social Connection and Isolation Panel    Frequency of Communication with Friends and Family: Three times a week    Frequency of Social Gatherings with Friends and Family: Three times a week    Attends Religious Services: 1 to 4 times per year    Active Member of Clubs or Organizations: No    Attends Banker Meetings: Not on file    Marital Status: Never married  Intimate Partner Violence: Not At Risk (04/15/2024)   Humiliation, Afraid, Rape, and Kick questionnaire    Fear of Current or Ex-Partner: No    Emotionally Abused: No    Physically Abused: No    Sexually Abused: No    Past Medical History, Surgical history, Social history, and Family history were reviewed and updated as appropriate.   Please see review of systems for further details on the patient's review from today.   Objective:   Physical Exam:  There were no vitals taken for this visit.  Physical Exam Constitutional:      General: She is not in acute distress. Musculoskeletal:        General: No deformity.  Neurological:     Mental Status: She is alert and oriented to person, place, and time.     Coordination: Coordination normal.  Psychiatric:        Attention and  Perception: Attention and perception normal. She does not perceive auditory or visual hallucinations.        Mood and Affect: Mood normal. Mood is not anxious or depressed. Affect is not labile, blunt, angry or inappropriate.        Speech: Speech normal.        Behavior: Behavior normal.        Thought Content: Thought content normal. Thought content is not paranoid or delusional. Thought content does not include homicidal or suicidal ideation. Thought content does not include homicidal or suicidal plan.        Cognition and Memory: Cognition and memory normal.        Judgment: Judgment normal.     Comments: Insight intact     Lab Review:     Component Value Date/Time   NA 138 01/11/2024 1213   K 4.9 01/11/2024 1213   CL 103 01/11/2024 1213   CO2 21 01/11/2024 1213   GLUCOSE 92 01/11/2024 1213   BUN 9 01/11/2024 1213   CREATININE 0.83 01/11/2024 1213   CALCIUM 9.8 01/11/2024 1213   PROT 7.2 01/11/2024 1213   ALBUMIN 4.7 01/11/2024 1213   AST 16 01/11/2024 1213   ALT 21 01/11/2024 1213   ALKPHOS 76 01/11/2024 1213   BILITOT 0.3 01/11/2024 1213       Component Value Date/Time   WBC 7.5 01/11/2024 1213   RBC 4.40 01/11/2024 1213   HGB 13.1 01/11/2024 1213   HCT 40.6 01/11/2024 1213   PLT 329 01/11/2024 1213   MCV 92 01/11/2024 1213   MCH 29.8 01/11/2024 1213   MCHC 32.3 01/11/2024 1213   RDW 13.1 01/11/2024 1213   LYMPHSABS 2.0 01/11/2024 1213   EOSABS 0.0 01/11/2024 1213   BASOSABS 0.0 01/11/2024 1213    No results found for: POCLITH, LITHIUM   No results found for: PHENYTOIN, PHENOBARB, VALPROATE, CBMZ   .res Assessment: Plan:    Plan:  Risperdal  1.5mg  at bedtime for mood symptoms.    Pristiq   100mg  daily   Buspar  10mg  BID  Adderall 30mg  daily - taking 1/2 twice daily.  PDMP reviewed  RTC 6 weeks   Therapist - Deane Sprang  25 minutes spent dedicated to the care of this patient on the date of this encounter to include pre-visit review of  records, ordering of medication, post visit documentation, and face-to-face time with the patient discussing ADD, GAD, OCD and substance use.  Discussed potential metabolic side effects associated with atypical antipsychotics, as well as potential risk for movement side effects. Advised pt to contact office if movement side effects occur.    Discussed potential benefits, risks, and side effects of stimulants with patient to include increased heart rate, palpitations, insomnia, increased anxiety, increased irritability, or decreased appetite. Instructed patient to contact office if experiencing any significant tolerability issues.   Patient advised to contact office with any questions, adverse effects, or acute worsening in signs and symptoms.  There are no diagnoses linked to this encounter.   Please see After Visit Summary for patient specific instructions.  Future Appointments  Date Time Provider Department Center  05/21/2024 10:00 AM Yong Grieser, Kathryn Mattocks, NP CP-CP None  05/30/2024 10:00 AM Sprang Almarie DASEN, [facility] CP-CP None  06/06/2024  9:00 AM Sprang Almarie DASEN, [facility] CP-CP None  06/13/2024 11:00 AM Sprang Almarie DASEN, [facility] CP-CP None  06/20/2024  9:00 AM Sprang Almarie DASEN, [facility] CP-CP None  06/27/2024  9:00 AM Sprang Almarie DASEN, [facility] CP-CP None  05/08/2025  8:45 AM Randol Dawes, MD P[facility] [phone] [place]    No orders of the defined types were placed in this encounter.     -------------------------------

## 2024-05-30 ENCOUNTER — Ambulatory Visit: Admitting: Professional Counselor

## 2024-05-30 ENCOUNTER — Encounter: Payer: Self-pay | Admitting: Professional Counselor

## 2024-05-30 DIAGNOSIS — F411 Generalized anxiety disorder: Secondary | ICD-10-CM

## 2024-05-30 DIAGNOSIS — F330 Major depressive disorder, recurrent, mild: Secondary | ICD-10-CM

## 2024-05-30 NOTE — Progress Notes (Addendum)
      [facility] Counselor/Therapist Progress Note  Patient ID: Kathryn Blanchard, MRN: [id number],    Date: 05/30/2024  Time Spent: 10:09 AM to 11:05 AM  Treatment Type: Individual Therapy  Reported Symptoms: Anhedonia, low mood, fatigue, self-esteem concerns, restlessness, nervousness, worries, trouble relaxing, career concerns, phase of life concerns, stress  Mental Status Exam:  Appearance:   Neat     Behavior:  Appropriate and Sharing  Motor:  Normal  Speech/Language:   Clear and Coherent and Normal Rate  Affect:  Appropriate and Congruent  Mood:  normal  Thought process:  normal  Thought content:    WNL  Sensory/Perceptual disturbances:    WNL  Orientation:  oriented to person, place, time/date, and situation  Attention:  Good  Concentration:  Good  Memory:  WNL  Fund of knowledge:   Good  Insight:    Good  Judgment:   Good  Impulse Control:  Good   Risk Assessment: Danger to Self:  No Self-injurious Behavior: No Danger to Others: No Duty to Warn:no Physical Aggression / Violence:No  Access to Firearms a concern: No  Gang Involvement:No   Subjective: Patient presented to session to address concerns of anxiety and depression.  Patient reported progress at this time.  Counselor and patient discussed patient's symptoms, counselor facilitated PHQ-9 with a score of 7 and GAD 7 with a score of 7.  We discussed reduction of measurements by half since assessment in spring.  Patient identified continued trauma triggers including dreams and physical reminders of places, however overall symptomology to be much improved.  She reported staying off social media and time passing as helpful, and to continue to resource for coping skills.  Counselor reinforced patient proactive efforts.  Patient reported having gotten a part-time job, but to still be looking for a full-time work that she feels excited about.  Counselor and patient continue to discuss work that would feel healthy to patient  and not exacerbate any trauma response pattern; they discussed patient avoiding work that includes vulnerable populations at this time.  Patient voiced difficulty in thoughts of approaching past trauma; she identified wanting to move forward and embrace distraction, structure and busyness.  Counselor reinforced that patient approach to trauma is to be at her own pace and comfort level, and encouraged patient continued journaling and reflection.  Interventions: Solution-Oriented/Positive Psychology, Humanistic/Existential, and Insight-Oriented  Diagnosis:   ICD-10-CM   1. Generalized anxiety disorder  F41.1     2. Major depressive disorder, recurrent episode, mild (HCC)  F33.0       Plan: Patient is scheduled for follow-up; continue process work and developing coping skills.  STG between sessions for patient to continue job search, applying for roles that feel neutral in terms of her potential for trauma triggers, and to continue to resource her coping skills, and engage with therapeutic journaling as she feels led.  [name], [facility]

## 2024-06-06 ENCOUNTER — Ambulatory Visit: Admitting: Professional Counselor

## 2024-06-11 ENCOUNTER — Ambulatory Visit: Admitting: Professional Counselor

## 2024-06-13 ENCOUNTER — Ambulatory Visit: Admitting: Professional Counselor

## 2024-06-20 ENCOUNTER — Ambulatory Visit: Admitting: Professional Counselor

## 2024-06-27 ENCOUNTER — Ambulatory Visit: Admitting: Professional Counselor

## 2024-07-02 ENCOUNTER — Ambulatory Visit (INDEPENDENT_AMBULATORY_CARE_PROVIDER_SITE_OTHER): Admitting: Adult Health

## 2024-07-02 ENCOUNTER — Encounter: Payer: Self-pay | Admitting: Adult Health

## 2024-07-02 DIAGNOSIS — F411 Generalized anxiety disorder: Secondary | ICD-10-CM | POA: Diagnosis not present

## 2024-07-02 DIAGNOSIS — F902 Attention-deficit hyperactivity disorder, combined type: Secondary | ICD-10-CM

## 2024-07-02 DIAGNOSIS — F429 Obsessive-compulsive disorder, unspecified: Secondary | ICD-10-CM | POA: Diagnosis not present

## 2024-07-02 DIAGNOSIS — F909 Attention-deficit hyperactivity disorder, unspecified type: Secondary | ICD-10-CM

## 2024-07-02 DIAGNOSIS — F428 Other obsessive-compulsive disorder: Secondary | ICD-10-CM

## 2024-07-02 MED ORDER — AMPHETAMINE-DEXTROAMPHETAMINE 20 MG PO TABS: 20.0000 mg | ORAL_TABLET | Freq: Two times a day (BID) | ORAL | 0 refills | Status: DC

## 2024-07-02 MED ORDER — RISPERIDONE 1 MG PO TABS: ORAL_TABLET | ORAL | 2 refills | Status: DC

## 2024-07-02 NOTE — Progress Notes (Signed)
 Kathryn Blanchard [id number] [birth year] 23 y.o.  Subjective:   Patient ID:  Kathryn Blanchard is a 23 y.o. (DOB 03/12/2001) female.  Chief Complaint: No chief complaint on file.   HPI Kathryn Blanchard presents to the office today for follow-up of ADD, GAD, and OCD.  Accompanied by mother.  Describes mood today as ok. Pleasant. Reports tearfulness. Mood symptoms - reports depression. Reports some anxiety. Denies irritability.  Reports decreased interest and motivation. Denies recent outbursts. Denies panic attacks. Denies worry, rumination and over thinking. Denies obsessive thoughts and acts. Denies getting hyper focused. Denies recent paranoia. Reports mood as more lower. Stating I feel like I'm hanging in there.  Reports taking medications as prescribed - Pristiq  100mg  daily, Adderall 30mg  daily, Buspar  10mg  BID and Risperdal  1.5 mg to help with mood symptoms. Reports she would like to try and taper off the Risperdal . She would also like to increase her Adderall dose. Energy levels lower. Active, has a regular exercise routine. Enjoys some usual interests and activities. Single. Not dating. Staying in [place] full time. Spending time with family.  Appetite increased. Weight gain - 145 pounds - 66.  Sleeps well most nights. Averages 8 more hours.  Reports focus and concentration is better - restarted Adderall 30mg  daily - 1/2 tablet twice daily - only lasting a few hours. Completing tasks. Managing aspects of household. Currently unemployed as a Social worker. Denies SI or HI.  Denies AH or VH. Denies self harm. Denies paranoia. Denies substance use. Reports alcohol use - reports drinking 4 to 5 glasses of wine a week. Denies THC - delta 8.   Previous medication trials: Effexor , Buspar , Zoloft     AUDIT    Flowsheet Row Office Visit from 04/15/2024 in [facility] Office Visit from 01/11/2024 in [facility]  Alcohol Use Disorder Identification Test Final Score (AUDIT) 5  12     GAD-7    Flowsheet Row Counselor from 05/30/2024 in [facility] Counselor from 01/23/2024 in [facility] Office Visit from 04/18/2022 in [facility] Office Visit from 03/16/2022 in [facility] Clinical Support from 09/15/2021 in [facility]  Total GAD-7 Score 7 17 8 6 3    PHQ2-9    Flowsheet Row Counselor from 05/30/2024 in [facility] Office Visit from 04/15/2024 in [facility] Counselor from 01/23/2024 in [facility] Office Visit from 03/27/2023 in [facility] Office Visit from 04/18/2022 in [facility]  PHQ-2 Total Score 2 0 4 0 0  PHQ-9 Total Score 7 -- 14 -- --     Review of Systems:  Review of Systems  Musculoskeletal:  Negative for gait problem.  Neurological:  Negative for tremors.  Psychiatric/Behavioral:         Please refer to HPI    Medications: I have reviewed the patient's current medications.  Current Outpatient Medications  Medication Sig Dispense Refill   amphetamine -dextroamphetamine  (ADDERALL) 30 MG tablet Take 1 tablet by mouth daily. 30 tablet 0   busPIRone  (BUSPAR ) 10 MG tablet Take 1 tablet (10 mg total) by mouth 2 (two) times daily. 60 tablet 2   clindamycin -benzoyl peroxide (BENZACLIN) gel Apply topically 2 (two) times daily. 50 g 3   desvenlafaxine  (PRISTIQ ) 100 MG 24 hr tablet Take 1 tablet (100 mg total) by mouth daily. 30 tablet 2   ISIBLOOM  0.15-30 MG-MCG tablet TAKE 1 TABLET BY MOUTH EVERY DAY 84 tablet 0  risperiDONE  (RISPERDAL ) 1 MG tablet Take one and 1/2 tablets at bedtime. 45 tablet 2   No current facility-administered medications for this visit.    Medication Side Effects: None  Allergies: No Known Allergies  Past Medical History:  Diagnosis Date   Acne vulgaris    ADHD (attention deficit hyperactivity disorder)    Diaphoresis    Fatigue    Generalized  anxiety disorder    h/o panic attacks   Obsessive-compulsive disorder     Past Medical History, Surgical history, Social history, and Family history were reviewed and updated as appropriate.   Please see review of systems for further details on the patient's review from today.   Objective:   Physical Exam:  There were no vitals taken for this visit.  Physical Exam Constitutional:      General: She is not in acute distress. Musculoskeletal:        General: No deformity.  Neurological:     Mental Status: She is alert and oriented to person, place, and time.     Coordination: Coordination normal.  Psychiatric:        Attention and Perception: Attention and perception normal. She does not perceive auditory or visual hallucinations.        Mood and Affect: Mood normal. Mood is not anxious or depressed. Affect is not labile, blunt, angry or inappropriate.        Speech: Speech normal.        Behavior: Behavior normal.        Thought Content: Thought content normal. Thought content is not paranoid or delusional. Thought content does not include homicidal or suicidal ideation. Thought content does not include homicidal or suicidal plan.        Cognition and Memory: Cognition and memory normal.        Judgment: Judgment normal.     Comments: Insight intact     Lab Review:     Component Value Date/Time   NA 138 01/11/2024 1213   K 4.9 01/11/2024 1213   CL 103 01/11/2024 1213   CO2 21 01/11/2024 1213   GLUCOSE 92 01/11/2024 1213   BUN 9 01/11/2024 1213   CREATININE 0.83 01/11/2024 1213   CALCIUM 9.8 01/11/2024 1213   PROT 7.2 01/11/2024 1213   ALBUMIN 4.7 01/11/2024 1213   AST 16 01/11/2024 1213   ALT 21 01/11/2024 1213   ALKPHOS 76 01/11/2024 1213   BILITOT 0.3 01/11/2024 1213       Component Value Date/Time   WBC 7.5 01/11/2024 1213   RBC 4.40 01/11/2024 1213   HGB 13.1 01/11/2024 1213   HCT 40.6 01/11/2024 1213   PLT 329 01/11/2024 1213   MCV 92 01/11/2024 1213    MCH 29.8 01/11/2024 1213   MCHC 32.3 01/11/2024 1213   RDW 13.1 01/11/2024 1213   LYMPHSABS 2.0 01/11/2024 1213   EOSABS 0.0 01/11/2024 1213   BASOSABS 0.0 01/11/2024 1213    No results found for: POCLITH, LITHIUM   No results found for: PHENYTOIN, PHENOBARB, VALPROATE, CBMZ   .res Assessment: Plan:    Plan:  Decrease Risperdal  1.5mg  to 1mg  at bedtime for mood symptoms. Discussed taper with patient and her mother - will reduce by 0.5mg  this visit due to mood lability. Will continue taper as tolerated.  Change Adderall 30mg  daily - taking 1/2 twice daily to 20mg  BID.    Pristiq  100mg  daily  Buspar  10mg  BID  PDMP reviewed  RTC 4 weeks   Therapist - Deane Sprang  25 minutes spent dedicated to the care of this patient on the date of this encounter to include pre-visit review of records, ordering of medication, post visit documentation, and face-to-face time with the patient discussing ADD, GAD, OCD and substance use.  Discussed potential metabolic side effects associated with atypical antipsychotics, as well as potential risk for movement side effects. Advised pt to contact office if movement side effects occur.    Discussed potential benefits, risks, and side effects of stimulants with patient to include increased heart rate, palpitations, insomnia, increased anxiety, increased irritability, or decreased appetite. Instructed patient to contact office if experiencing any significant tolerability issues.   Patient advised to contact office with any questions, adverse effects, or acute worsening in signs and symptoms.   There are no diagnoses linked to this encounter.   Please see After Visit Summary for patient specific instructions.  Future Appointments  Date Time Provider Department Center  07/02/2024  8:00 AM Jhoan Schmieder, Angeline Mattocks, NP CP-CP None  08/05/2024  9:00 AM Burnetta Almarie DASEN, [facility] CP-CP None  08/28/2024 11:00 AM Burnetta Almarie DASEN, [facility] CP-CP  None  05/08/2025  8:45 AM Randol Dawes, MD PFM-PFM [phone] [place]    No orders of the defined types were placed in this encounter.   -------------------------------

## 2024-07-02 NOTE — Addendum Note (Signed)
 Addended by: LAWERANCE ANGELINE SAILOR on: 07/02/2024 09:55 AM   Modules accepted: Level of Service

## 2024-07-30 ENCOUNTER — Encounter: Payer: Self-pay | Admitting: Adult Health

## 2024-07-30 ENCOUNTER — Telehealth: Admitting: Adult Health

## 2024-07-30 DIAGNOSIS — F902 Attention-deficit hyperactivity disorder, combined type: Secondary | ICD-10-CM

## 2024-07-30 DIAGNOSIS — F411 Generalized anxiety disorder: Secondary | ICD-10-CM

## 2024-07-30 DIAGNOSIS — F428 Other obsessive-compulsive disorder: Secondary | ICD-10-CM

## 2024-07-30 MED ORDER — AMPHETAMINE-DEXTROAMPHETAMINE 20 MG PO TABS: 20.0000 mg | ORAL_TABLET | Freq: Two times a day (BID) | ORAL | 0 refills | Status: DC

## 2024-07-30 MED ORDER — BUSPIRONE HCL 10 MG PO TABS: 10.0000 mg | ORAL_TABLET | Freq: Two times a day (BID) | ORAL | 2 refills | Status: DC

## 2024-07-30 MED ORDER — RISPERIDONE 0.5 MG PO TABS: ORAL_TABLET | ORAL | 2 refills | Status: DC

## 2024-07-30 MED ORDER — DESVENLAFAXINE SUCCINATE ER 100 MG PO TB24: 100.0000 mg | ORAL_TABLET | Freq: Every day | ORAL | 2 refills | Status: DC

## 2024-07-30 NOTE — Progress Notes (Signed)
 Kathryn Blanchard [id number] [birth year] 23 y.o.  Virtual Visit via Video Note  I connected with pt @ on 07/30/24 at  1:30 PM EST by a video enabled telemedicine application and verified that I am speaking with the correct person using two identifiers.   I discussed the limitations of evaluation and management by telemedicine and the availability of in person appointments. The patient expressed understanding and agreed to proceed.  I discussed the assessment and treatment plan with the patient. The patient was provided an opportunity to ask questions and all were answered. The patient agreed with the plan and demonstrated an understanding of the instructions.   The patient was advised to call back or seek an in-person evaluation if the symptoms worsen or if the condition fails to improve as anticipated.  I provided 25 minutes of non-face-to-face time during this encounter.  The patient was located at home.  The provider was located at [facility].   Kathryn LOISE Sayers, NP   Subjective:   Patient ID:  Kathryn Blanchard is a 23 y.o. (DOB [birth year]) female.  Chief Complaint: No chief complaint on file.   HPI Kathryn Blanchard presents for follow-up of ADD, GAD, and OCD.  Accompanied by mother.  Describes mood today as ok. Pleasant. Reports tearfulness at times. Mood symptoms - reports anxiety and depression - 6 to 7 out of 10. Denies irritability. Reports improved interest and motivation - but it's still lacking. Denies recent outbursts. Denies panic attacks. Denies worry, rumination and over thinking. Denies obsessive thoughts and acts. Denies getting hyper focused. Denies recent paranoia. Reports mood is still lower - but not as low. Stating I feel like I've come a long way. Reports taking medications as prescribed - Pristiq  100mg  daily, Adderall 20mg  twice daily, Buspar  10mg  BID and Risperdal  1mg  to help with mood symptoms. She would like to try and continue to taper off the Risperdal .   Energy levels lower. Active, does not have a regular exercise routine. Enjoys some usual interests and activities. Single. Not dating. Staying in [place] full time. Spending time with family.  Appetite increased. Weight gain - 145 to 150 pounds - 66.  Sleeps well most nights. Averages 8 more hours.  Reports focus and concentration is about the same - taking Adderall 20mg  twice daily. Completing tasks. Managing aspects of household. 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  Denies AH or VH. Denies self harm. Denies paranoia. Denies substance use. Reports alcohol use - reports drinking 4 to 6 glasses of wine a week. Denies THC - delta 8.   Previous medication trials: Effexor , Buspar , Zoloft      Review of Systems:  Review of Systems  Musculoskeletal:  Negative for gait problem.  Neurological:  Negative for tremors.  Psychiatric/Behavioral:         Please refer to HPI    Medications: I have reviewed the patient's current medications.  Current Outpatient Medications  Medication Sig Dispense Refill   amphetamine -dextroamphetamine  (ADDERALL) 20 MG tablet Take 1 tablet (20 mg total) by mouth 2 (two) times daily. 60 tablet 0   busPIRone  (BUSPAR ) 10 MG tablet Take 1 tablet (10 mg total) by mouth 2 (two) times daily. 60 tablet 2   clindamycin -benzoyl peroxide (BENZACLIN) gel Apply topically 2 (two) times daily. 50 g 3   desvenlafaxine  (PRISTIQ ) 100 MG 24 hr tablet Take 1 tablet (100 mg total) by mouth daily. 30 tablet 2   ISIBLOOM  0.15-30 MG-MCG tablet TAKE 1 TABLET BY MOUTH EVERY  DAY 84 tablet 0   risperiDONE  (RISPERDAL ) 0.5 MG tablet Take one tablet at bedtime. 30 tablet 2   No current facility-administered medications for this visit.    Medication Side Effects: None  Allergies: No Known Allergies  Past Medical History:  Diagnosis Date   Acne vulgaris    ADHD (attention deficit hyperactivity disorder)    Diaphoresis    Fatigue    Generalized anxiety disorder     h/o panic attacks   Obsessive-compulsive disorder     Family History  Problem Relation Age of Onset   Anxiety disorder Mother    ADD / ADHD Mother    Depression Mother    Asthma Mother    OCD Father    Hypertension Father    Hypercholesterolemia Father    Anxiety disorder Father    Stroke Paternal Grandfather        possibly in his 21's   Alcohol abuse Neg Hx    Bipolar disorder Neg Hx    Dementia Neg Hx    Drug abuse Neg Hx    Paranoid behavior Neg Hx    Physical abuse Neg Hx    Schizophrenia Neg Hx    Seizures Neg Hx    Sexual abuse Neg Hx    Cancer Neg Hx    Diabetes Neg Hx    Heart disease Neg Hx     Social History   Socioeconomic History   Marital status: Single    Spouse name: Not on file   Number of children: Not on file   Years of education: 13   Highest education level: Bachelor's degree (e.g., BA, AB, BS)  Occupational History   Occupation: not employed   Occupation: Consulting Civil Engineer  Tobacco Use   Smoking status: Never   Smokeless tobacco: Never  Vaping Use   Vaping status: Some Days   Substances: Synthetic cannabinoids  Substance and Sexual Activity   Alcohol use: Not Currently    Alcohol/week: 5.0 - 6.0 standard drinks of alcohol    Types: 5 - 6 Glasses of wine per week    Comment: 1-2 glasses of wine 3 days/week   Drug use: Not Currently    Comment: Delta 8 vape --none since 12/2023   Sexual activity: Not Currently    Partners: Male    Birth control/protection: Pill    Comment: condoms (with new relationships)  Other Topics Concern   Not on file  Social History Narrative   Graduated from [employer] 01/2022. Majored in theatre manager.      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      Currently living either at home with parents, or at her apartment in [place].   She is currently searching for a job.   Older brother lives in [place]      Updated 03/2024   Social Drivers of  Health   Financial Resource Strain: Low Risk  (04/14/2024)   Overall Financial Resource Strain (CARDIA)    Difficulty of Paying Living Expenses: Not very hard  Food Insecurity: No Food Insecurity (04/14/2024)   Hunger Vital Sign    Worried About Running Out of Food in the Last Year: Never true    Ran Out of Food in the Last Year: Never true  Transportation Needs: No Transportation Needs (04/14/2024)   PRAPARE - Administrator, Civil Service (Medical): No    Lack of Transportation (Non-Medical): No  Physical Activity: Sufficiently Active (04/14/2024)  Exercise Vital Sign    Days of Exercise per Week: 4 days    Minutes of Exercise per Session: 50 min  Stress: No Stress Concern Present (04/14/2024)   [employer] of Occupational Health - Occupational Stress Questionnaire    Feeling of Stress: Only a little  Social Connections: Moderately Isolated (04/14/2024)   Social Connection and Isolation Panel    Frequency of Communication with Friends and Family: Three times a week    Frequency of Social Gatherings with Friends and Family: Three times a week    Attends Religious Services: 1 to 4 times per year    Active Member of Clubs or Organizations: No    Attends Banker Meetings: Not on file    Marital Status: Never married  Intimate Partner Violence: Not At Risk (04/15/2024)   Humiliation, Afraid, Rape, and Kick questionnaire    Fear of Current or Ex-Partner: No    Emotionally Abused: No    Physically Abused: No    Sexually Abused: No    Past Medical History, Surgical history, Social history, and Family history were reviewed and updated as appropriate.   Please see review of systems for further details on the patient's review from today.   Objective:   Physical Exam:  There were no vitals taken for this visit.  Physical Exam Constitutional:      General: She is not in acute distress. Musculoskeletal:        General: No deformity.  Neurological:      Mental Status: She is alert and oriented to person, place, and time.     Coordination: Coordination normal.  Psychiatric:        Attention and Perception: Attention and perception normal. She does not perceive auditory or visual hallucinations.        Mood and Affect: Mood normal. Mood is not anxious or depressed. Affect is not labile, blunt, angry or inappropriate.        Speech: Speech normal.        Behavior: Behavior normal.        Thought Content: Thought content normal. Thought content is not paranoid or delusional. Thought content does not include homicidal or suicidal ideation. Thought content does not include homicidal or suicidal plan.        Cognition and Memory: Cognition and memory normal.        Judgment: Judgment normal.     Comments: Insight intact     Lab Review:     Component Value Date/Time   NA 138 01/11/2024 1213   K 4.9 01/11/2024 1213   CL 103 01/11/2024 1213   CO2 21 01/11/2024 1213   GLUCOSE 92 01/11/2024 1213   BUN 9 01/11/2024 1213   CREATININE 0.83 01/11/2024 1213   CALCIUM 9.8 01/11/2024 1213   PROT 7.2 01/11/2024 1213   ALBUMIN 4.7 01/11/2024 1213   AST 16 01/11/2024 1213   ALT 21 01/11/2024 1213   ALKPHOS 76 01/11/2024 1213   BILITOT 0.3 01/11/2024 1213       Component Value Date/Time   WBC 7.5 01/11/2024 1213   RBC 4.40 01/11/2024 1213   HGB 13.1 01/11/2024 1213   HCT 40.6 01/11/2024 1213   PLT 329 01/11/2024 1213   MCV 92 01/11/2024 1213   MCH 29.8 01/11/2024 1213   MCHC 32.3 01/11/2024 1213   RDW 13.1 01/11/2024 1213   LYMPHSABS 2.0 01/11/2024 1213   EOSABS 0.0 01/11/2024 1213   BASOSABS 0.0 01/11/2024 1213    No results  found for: POCLITH, LITHIUM   No results found for: PHENYTOIN, PHENOBARB, VALPROATE, CBMZ   .res Assessment: Plan:    Plan:  Risperdal  1mg  to 0.5mg  at bedtime for mood symptoms. Discussed taper with patient - will reduce by 0.5mg  this visit due to mood lability. Will continue taper as  tolerated.  Adderall 20mg  BID.  Pristiq  100mg  daily  Buspar  10mg  BID  PDMP reviewed  RTC 4 weeks   Therapist - Deane Sprang  25 minutes spent dedicated to the care of this patient on the date of this encounter to include pre-visit review of records, ordering of medication, post visit documentation, and face-to-face time with the patient discussing ADD, GAD, and OCD.  Discussed potential metabolic side effects associated with atypical antipsychotics, as well as potential risk for movement side effects. Advised pt to contact office if movement side effects occur.    Discussed potential benefits, risks, and side effects of stimulants with patient to include increased heart rate, palpitations, insomnia, increased anxiety, increased irritability, or decreased appetite. Instructed patient to contact office if experiencing any significant tolerability issues.   Patient advised to contact office with any questions, adverse effects, or acute worsening in signs and symptoms.  Diagnoses and all orders for this visit:  Generalized anxiety disorder -     desvenlafaxine  (PRISTIQ ) 100 MG 24 hr tablet; Take 1 tablet (100 mg total) by mouth daily. -     busPIRone  (BUSPAR ) 10 MG tablet; Take 1 tablet (10 mg total) by mouth 2 (two) times daily. -     risperiDONE  (RISPERDAL ) 0.5 MG tablet; Take one tablet at bedtime.  Attention deficit hyperactivity disorder (ADHD), combined type, moderate -     amphetamine -dextroamphetamine  (ADDERALL) 20 MG tablet; Take 1 tablet (20 mg total) by mouth 2 (two) times daily. -     risperiDONE  (RISPERDAL ) 0.5 MG tablet; Take one tablet at bedtime.  Other obsessive-compulsive disorder -     risperiDONE  (RISPERDAL ) 0.5 MG tablet; Take one tablet at bedtime.  Substance use     Please see After Visit Summary for patient specific instructions.  Future Appointments  Date Time Provider Department Center  08/05/2024  9:00 AM Sprang Almarie DASEN [facility] CP-CP None   08/28/2024 11:00 AM Sprang Almarie DASEN, [facility] CP-CP None  05/08/2025  8:45 AM Randol Dawes, MD PFM-PFM [phone] [place]    No orders of the defined types were placed in this encounter.     -------------------------------

## 2024-08-05 ENCOUNTER — Ambulatory Visit: Admitting: Professional Counselor

## 2024-08-05 ENCOUNTER — Encounter: Payer: Self-pay | Admitting: Professional Counselor

## 2024-08-05 DIAGNOSIS — F411 Generalized anxiety disorder: Secondary | ICD-10-CM | POA: Diagnosis not present

## 2024-08-05 DIAGNOSIS — F331 Major depressive disorder, recurrent, moderate: Secondary | ICD-10-CM | POA: Diagnosis not present

## 2024-08-05 NOTE — Progress Notes (Signed)
°      [facility] Counselor/Therapist Progress Note  Patient ID: Kathryn Blanchard, MRN: [id number],    Date: 08/05/2024  Time Spent: 9:16 AM to 10:08 AM  Treatment Type: Family Therapy  Patient presented to session with mother.  Reported Symptoms: Stress, sadness, worries, low motivation, fatigue, career concerns, phase of life concerns, low mood, anhedonia, self-esteem concerns, anxiousness, fatigue  Mental Status Exam:  Appearance:   Casual     Behavior:  Appropriate and Sharing  Motor:  Normal  Speech/Language:   Clear and Coherent and Normal Rate  Affect:  Appropriate and Congruent  Mood:  normal  Thought process:  normal  Thought content:    WNL  Sensory/Perceptual disturbances:    WNL  Orientation:  oriented to person, place, time/date, and situation  Attention:  Good  Concentration:  Good  Memory:  WNL  Fund of knowledge:   Good  Insight:    Good  Judgment:   Good  Impulse Control:  Good   Risk Assessment: Danger to Self:  No Self-injurious Behavior: No Danger to Others: No Duty to Warn:no Physical Aggression / Violence:No  Access to Firearms a concern: No  Gang Involvement:No   Subjective: Patient presented to session to address concerns of depression and anxiety.  Patient presented to session with her mother.  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  Counselor discussed self advocacy strategies with patient in her current role.  Patient and parent also discussed patient considerations around a move to a different town and other changes in patient's circumstances.  Patient parent voiced concern with patient depression including self-esteem and body image concerns.  Counselor encouraged consistent therapy for patient, and trauma therapy in particular, and provided referrals for local EMDR and somatic practitioners where  she lives, including via [facility] and the [facility].  Counselor also encouraged patient to crosscheck referrals with insurance, and consider insurance referrals as well.  Counselor discussed family therapy opportunities with patient and patient parent, in discussion of trauma by history.  Counselor helped to reinforce coping skills including patient attention to daily self-care: Fresh air, body movement, connection with others, good nutrition, adequate sleep, and positive self-talk as priorities.  Interventions: Solution-Oriented/Positive Psychology, Humanistic/Existential, Insight-Oriented, Interpersonal, and Resourcing, Referrals  Diagnosis:   ICD-10-CM   1. Major depressive disorder, recurrent episode, moderate (HCC)  F33.1     2. Generalized anxiety disorder  F41.1       Plan: Patient follow-up to be determined; patient to consider referrals provided for EMDR and somatic trauma work.  Patient encouraged to engage in consistent therapeutic care.  Patient short-term goal to pay attention to daily self care priorities as discussed.  Kathryn Blanchard, [facility]

## 2024-08-27 ENCOUNTER — Other Ambulatory Visit: Payer: Self-pay | Admitting: Adult Health

## 2024-08-27 DIAGNOSIS — F902 Attention-deficit hyperactivity disorder, combined type: Secondary | ICD-10-CM

## 2024-08-27 DIAGNOSIS — F428 Other obsessive-compulsive disorder: Secondary | ICD-10-CM

## 2024-08-27 DIAGNOSIS — F411 Generalized anxiety disorder: Secondary | ICD-10-CM

## 2024-08-28 ENCOUNTER — Encounter: Payer: Self-pay | Admitting: Adult Health

## 2024-08-28 ENCOUNTER — Ambulatory Visit: Admitting: Professional Counselor

## 2024-08-28 ENCOUNTER — Telehealth: Admitting: Adult Health

## 2024-08-28 DIAGNOSIS — F428 Other obsessive-compulsive disorder: Secondary | ICD-10-CM

## 2024-08-28 DIAGNOSIS — F411 Generalized anxiety disorder: Secondary | ICD-10-CM

## 2024-08-28 DIAGNOSIS — F909 Attention-deficit hyperactivity disorder, unspecified type: Secondary | ICD-10-CM | POA: Diagnosis not present

## 2024-08-28 DIAGNOSIS — F902 Attention-deficit hyperactivity disorder, combined type: Secondary | ICD-10-CM

## 2024-08-28 NOTE — Progress Notes (Signed)
 Kathryn Blanchard [id number] [birth year] 23 y.o.  Virtual Visit via Video Note  I connected with pt @ on 08/28/24 at  1:30 PM EST by a video enabled telemedicine application and verified that I am speaking with the correct person using two identifiers.   I discussed the limitations of evaluation and management by telemedicine and the availability of in person appointments. The patient expressed understanding and agreed to proceed.  I discussed the assessment and treatment plan with the patient. The patient was provided an opportunity to ask questions and all were answered. The patient agreed with the plan and demonstrated an understanding of the instructions.   The patient was advised to call back or seek an in-person evaluation if the symptoms worsen or if the condition fails to improve as anticipated.  I provided 25 minutes of non-face-to-face time during this encounter.  The patient was located at home.  The provider was located at [facility].   Kathryn LOISE Sayers, NP   Subjective:   Patient ID:  Kathryn Blanchard is a 23 y.o. (DOB 02-May-2001) female.  Chief Complaint: No chief complaint on file.   HPI Kathryn Blanchard presents for follow-up of ADD, GAD, and OCD.  Describes mood today as ok. Pleasant. Denies recent tearfulness. Mood symptoms - reports decreased anxiety and depression. Denies irritability. Reports improved interest and motivation - a little bit. Denies recent outbursts. Denies panic attacks. Denies worry, rumination and over thinking. Denies obsessive thoughts and acts. Denies getting hyper focused. Denies recent paranoia. Reports mood has improved. Stating I feel like I'm doing better. Reports taking medications as prescribed - Pristiq  100mg  daily, Adderall 20mg  twice daily, Buspar  10mg  BID and will reduce Risperdal  0.5mg  at bedtime to 0.25mg  at bedtime for mood symptoms. Taking medications as prescribed. Energy levels improved. Active, walking on treadmill 3 times a  week.   Enjoys some usual interests and activities. Single. Not dating. Staying in [place] full time. Spending time with family.  Appetite increased. Weight loss - 1 to 2 pounds - 150 pounds - 66.  Sleeps well most nights. Averages 8 more hours.  Reports focus and concentration is better - taking Adderall 20mg  twice daily. Completing tasks. Managing aspects of household. 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  Denies AH or VH. Denies self harm. Denies paranoia. Denies substance use. Reports alcohol use - reports drinking 4 to 6 glasses of wine a week. Denies THC - delta 8.   Previous medication trials: Effexor , Buspar , Zoloft    Review of Systems:  Review of Systems  Musculoskeletal:  Negative for gait problem.  Neurological:  Negative for tremors.  Psychiatric/Behavioral:         Please refer to HPI    Medications: I have reviewed the patient's current medications.  Current Outpatient Medications  Medication Sig Dispense Refill   amphetamine -dextroamphetamine  (ADDERALL) 20 MG tablet Take 1 tablet (20 mg total) by mouth 2 (two) times daily. 60 tablet 0   busPIRone  (BUSPAR ) 10 MG tablet Take 1 tablet (10 mg total) by mouth 2 (two) times daily. 60 tablet 2   clindamycin -benzoyl peroxide (BENZACLIN) gel Apply topically 2 (two) times daily. 50 g 3   desvenlafaxine  (PRISTIQ ) 100 MG 24 hr tablet Take 1 tablet (100 mg total) by mouth daily. 30 tablet 2   ISIBLOOM  0.15-30 MG-MCG tablet TAKE 1 TABLET BY MOUTH EVERY DAY 84 tablet 0   risperiDONE  (RISPERDAL ) 0.5 MG tablet Take one tablet at bedtime. 30 tablet 2   No  current facility-administered medications for this visit.    Medication Side Effects: None  Allergies: No Known Allergies  Past Medical History:  Diagnosis Date   Acne vulgaris    ADHD (attention deficit hyperactivity disorder)    Diaphoresis    Fatigue    Generalized anxiety disorder    h/o panic attacks   Obsessive-compulsive disorder     Family  History  Problem Relation Age of Onset   Anxiety disorder Mother    ADD / ADHD Mother    Depression Mother    Asthma Mother    OCD Father    Hypertension Father    Hypercholesterolemia Father    Anxiety disorder Father    Stroke Paternal Grandfather        possibly in his 48's   Alcohol abuse Neg Hx    Bipolar disorder Neg Hx    Dementia Neg Hx    Drug abuse Neg Hx    Paranoid behavior Neg Hx    Physical abuse Neg Hx    Schizophrenia Neg Hx    Seizures Neg Hx    Sexual abuse Neg Hx    Cancer Neg Hx    Diabetes Neg Hx    Heart disease Neg Hx     Social History   Socioeconomic History   Marital status: Single    Spouse name: Not on file   Number of children: Not on file   Years of education: 13   Highest education level: Bachelor's degree (e.g., BA, AB, BS)  Occupational History   Occupation: not employed   Occupation: Consulting Civil Engineer  Tobacco Use   Smoking status: Never   Smokeless tobacco: Never  Vaping Use   Vaping status: Some Days   Substances: Synthetic cannabinoids  Substance and Sexual Activity   Alcohol use: Not Currently    Alcohol/week: 5.0 - 6.0 standard drinks of alcohol    Types: 5 - 6 Glasses of wine per week    Comment: 1-2 glasses of wine 3 days/week   Drug use: Not Currently    Comment: Delta 8 vape --none since 12/2023   Sexual activity: Not Currently    Partners: Male    Birth control/protection: Pill    Comment: condoms (with new relationships)  Other Topics Concern   Not on file  Social History Narrative   Graduated from [employer] 01/2022. Majored in theatre manager.      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      Currently living either at home with parents, or at her apartment in [place].   She is currently searching for a job.   Older brother lives in [place]      Updated 03/2024   Social Drivers of Health   Financial Resource Strain: Low Risk  (04/14/2024)    Overall Financial Resource Strain (CARDIA)    Difficulty of Paying Living Expenses: Not very hard  Food Insecurity: No Food Insecurity (04/14/2024)   Hunger Vital Sign    Worried About Running Out of Food in the Last Year: Never true    Ran Out of Food in the Last Year: Never true  Transportation Needs: No Transportation Needs (04/14/2024)   PRAPARE - Administrator, Civil Service (Medical): No    Lack of Transportation (Non-Medical): No  Physical Activity: Sufficiently Active (04/14/2024)   Exercise Vital Sign    Days of Exercise per Week: 4 days    Minutes of Exercise per Session:  50 min  Stress: No Stress Concern Present (04/14/2024)   [employer] of Occupational Health - Occupational Stress Questionnaire    Feeling of Stress: Only a little  Social Connections: Moderately Isolated (04/14/2024)   Social Connection and Isolation Panel    Frequency of Communication with Friends and Family: Three times a week    Frequency of Social Gatherings with Friends and Family: Three times a week    Attends Religious Services: 1 to 4 times per year    Active Member of Clubs or Organizations: No    Attends Banker Meetings: Not on file    Marital Status: Never married  Intimate Partner Violence: Not At Risk (04/15/2024)   Humiliation, Afraid, Rape, and Kick questionnaire    Fear of Current or Ex-Partner: No    Emotionally Abused: No    Physically Abused: No    Sexually Abused: No    Past Medical History, Surgical history, Social history, and Family history were reviewed and updated as appropriate.   Please see review of systems for further details on the patient's review from today.   Objective:   Physical Exam:  There were no vitals taken for this visit.  Physical Exam Constitutional:      General: She is not in acute distress. Musculoskeletal:        General: No deformity.  Neurological:     Mental Status: She is alert and oriented to person, place, and  time.     Coordination: Coordination normal.  Psychiatric:        Attention and Perception: Attention and perception normal. She does not perceive auditory or visual hallucinations.        Mood and Affect: Mood normal. Mood is not anxious or depressed. Affect is not labile, blunt, angry or inappropriate.        Speech: Speech normal.        Behavior: Behavior normal.        Thought Content: Thought content normal. Thought content is not paranoid or delusional. Thought content does not include homicidal or suicidal ideation. Thought content does not include homicidal or suicidal plan.        Cognition and Memory: Cognition and memory normal.        Judgment: Judgment normal.     Comments: Insight intact     Lab Review:     Component Value Date/Time   NA 138 01/11/2024 1213   K 4.9 01/11/2024 1213   CL 103 01/11/2024 1213   CO2 21 01/11/2024 1213   GLUCOSE 92 01/11/2024 1213   BUN 9 01/11/2024 1213   CREATININE 0.83 01/11/2024 1213   CALCIUM 9.8 01/11/2024 1213   PROT 7.2 01/11/2024 1213   ALBUMIN 4.7 01/11/2024 1213   AST 16 01/11/2024 1213   ALT 21 01/11/2024 1213   ALKPHOS 76 01/11/2024 1213   BILITOT 0.3 01/11/2024 1213       Component Value Date/Time   WBC 7.5 01/11/2024 1213   RBC 4.40 01/11/2024 1213   HGB 13.1 01/11/2024 1213   HCT 40.6 01/11/2024 1213   PLT 329 01/11/2024 1213   MCV 92 01/11/2024 1213   MCH 29.8 01/11/2024 1213   MCHC 32.3 01/11/2024 1213   RDW 13.1 01/11/2024 1213   LYMPHSABS 2.0 01/11/2024 1213   EOSABS 0.0 01/11/2024 1213   BASOSABS 0.0 01/11/2024 1213    No results found for: POCLITH, LITHIUM   No results found for: PHENYTOIN, PHENOBARB, VALPROATE, CBMZ   .res Assessment: Plan:  Plan:  Reduce Risperdal  0.5mg  to 0.25 at bedtime for mood symptoms. Will plan to taper off if mood remains stable.  Adderall 20mg  BID.  Pristiq  100mg  daily  Buspar  10mg  BID  PDMP reviewed  RTC 4 weeks   Therapist - Deane Sprang  25  minutes spent dedicated to the care of this patient on the date of this encounter to include pre-visit review of records, ordering of medication, post visit documentation, and face-to-face time with the patient discussing ADD, GAD, and OCD.  Discussed potential metabolic side effects associated with atypical antipsychotics, as well as potential risk for movement side effects. Advised pt to contact office if movement side effects occur.    Discussed potential benefits, risks, and side effects of stimulants with patient to include increased heart rate, palpitations, insomnia, increased anxiety, increased irritability, or decreased appetite. Instructed patient to contact office if experiencing any significant tolerability issues.   Patient advised to contact office with any questions, adverse effects, or acute worsening in signs and symptoms.  There are no diagnoses linked to this encounter.   Please see After Visit Summary for patient specific instructions.  Future Appointments  Date Time Provider Department Center  08/28/2024  1:30 PM Shirl Ludington Nattalie, NP CP-CP None  05/08/2025  8:45 AM Randol Dawes, MD [facility] [phone] [place]    No orders of the defined types were placed in this encounter.     -------------------------------

## 2024-08-30 NOTE — Telephone Encounter (Signed)
 Per last office note, pt is tapering down off Risperdal  to 0.25 mg. Will update RX and not send a 90 day

## 2024-09-06 ENCOUNTER — Other Ambulatory Visit: Payer: Self-pay

## 2024-09-06 ENCOUNTER — Telehealth: Payer: Self-pay | Admitting: Adult Health

## 2024-09-06 DIAGNOSIS — F902 Attention-deficit hyperactivity disorder, combined type: Secondary | ICD-10-CM

## 2024-09-06 NOTE — Telephone Encounter (Signed)
 Pended

## 2024-09-06 NOTE — Telephone Encounter (Signed)
 Kathryn Blanchard called asking for a refill on her adderall 20 mg. Pharmacy is [facility] on [address] in [place]

## 2024-09-09 ENCOUNTER — Other Ambulatory Visit: Payer: Self-pay

## 2024-09-09 DIAGNOSIS — F902 Attention-deficit hyperactivity disorder, combined type: Secondary | ICD-10-CM

## 2024-09-09 MED ORDER — AMPHETAMINE-DEXTROAMPHETAMINE 20 MG PO TABS: 20.0000 mg | ORAL_TABLET | Freq: Two times a day (BID) | ORAL | 0 refills | Status: DC

## 2024-09-09 NOTE — Telephone Encounter (Signed)
 Patient called in regarding previous message and stated that she needs prescription for Adderall 20mg  sent to [facility] [address] instead. PH: [phone] Appt 1/20

## 2024-09-09 NOTE — Telephone Encounter (Signed)
 Pended

## 2024-09-29 ENCOUNTER — Other Ambulatory Visit: Payer: Self-pay | Admitting: Adult Health

## 2024-09-29 DIAGNOSIS — F411 Generalized anxiety disorder: Secondary | ICD-10-CM

## 2024-09-29 DIAGNOSIS — F902 Attention-deficit hyperactivity disorder, combined type: Secondary | ICD-10-CM

## 2024-09-29 DIAGNOSIS — F428 Other obsessive-compulsive disorder: Secondary | ICD-10-CM

## 2024-10-03 NOTE — Telephone Encounter (Signed)
 Reduce Risperdal  0.5mg  to 0.25 at bedtime for mood symptoms. Will plan to taper off if mood remains stable.   Has appt 1/20.

## 2024-10-08 ENCOUNTER — Encounter: Payer: Self-pay | Admitting: Adult Health

## 2024-10-08 ENCOUNTER — Telehealth: Admitting: Adult Health

## 2024-10-08 DIAGNOSIS — F909 Attention-deficit hyperactivity disorder, unspecified type: Secondary | ICD-10-CM | POA: Diagnosis not present

## 2024-10-08 DIAGNOSIS — F411 Generalized anxiety disorder: Secondary | ICD-10-CM

## 2024-10-08 DIAGNOSIS — F429 Obsessive-compulsive disorder, unspecified: Secondary | ICD-10-CM

## 2024-10-08 DIAGNOSIS — F428 Other obsessive-compulsive disorder: Secondary | ICD-10-CM

## 2024-10-08 DIAGNOSIS — F902 Attention-deficit hyperactivity disorder, combined type: Secondary | ICD-10-CM

## 2024-10-08 MED ORDER — BUSPIRONE HCL 10 MG PO TABS: 10.0000 mg | ORAL_TABLET | Freq: Two times a day (BID) | ORAL | 2 refills | Status: AC

## 2024-10-08 MED ORDER — AMPHETAMINE-DEXTROAMPHETAMINE 20 MG PO TABS: 20.0000 mg | ORAL_TABLET | Freq: Two times a day (BID) | ORAL | 0 refills | Status: AC

## 2024-10-08 MED ORDER — DESVENLAFAXINE SUCCINATE ER 100 MG PO TB24: 100.0000 mg | ORAL_TABLET | Freq: Every day | ORAL | 2 refills | Status: AC

## 2024-10-08 NOTE — Progress Notes (Signed)
 Kathryn Blanchard [id number] 08-23-2001 24 y.o.  Virtual Visit via Video Note  I connected with pt @ on 10/08/24 at  1:30 PM EST by a video enabled telemedicine application and verified that I am speaking with the correct person using two identifiers.   I discussed the limitations of evaluation and management by telemedicine and the availability of in person appointments. The patient expressed understanding and agreed to proceed.  I discussed the assessment and treatment plan with the patient. The patient was provided an opportunity to ask questions and all were answered. The patient agreed with the plan and demonstrated an understanding of the instructions.   The patient was advised to call back or seek an in-person evaluation if the symptoms worsen or if the condition fails to improve as anticipated.  I provided 15 minutes of non-face-to-face time during this encounter.  The patient was located at home.  The provider was located at [facility].   [name], NP   Subjective:   Patient ID:  Kathryn Blanchard is a 24 y.o. (DOB 12-10-2000) female.  Chief Complaint: No chief complaint on file.   HPI Kathryn Blanchard presents for follow-up of ADD, GAD, and OCD.  Describes mood today as ok. Pleasant. Denies recent tearfulness. Mood symptoms - denies anxiety, depression or irritability. Reports improved interest and motivation. Denies recent outbursts. Denies panic attacks. Denies worry, rumination and over thinking. Denies obsessive thoughts and acts. Denies getting hyper focused. Denies recent paranoia. Reports mood is stable. Stating I feel like I'm doing a lot better. Reports taking medications as prescribed. Taking medications as prescribed. Energy levels improved. Active, walking on treadmill 3 times a week.   Enjoys some usual interests and activities. Single - living with a roommate. Not dating. Staying in [place] full time. Spending time with family.  Appetite adequate.  Weight stable - 150 pounds - 66.  Sleeps well most nights. Averages 8 hours.  Reports focus and concentration is better - taking Adderall 20mg  twice daily. Completing tasks. Managing aspects of household. Currently employed as a social worker. Denies SI or HI.  Denies AH or VH. Denies self harm. Denies paranoia. Denies substance use. Reports alcohol use - reports drinking 4 to 6 glasses of wine a week. Denies THC - delta 8.   Previous medication trials: Effexor , Buspar , Zoloft    Review of Systems:  Review of Systems  Musculoskeletal:  Negative for gait problem.  Neurological:  Negative for tremors.  Psychiatric/Behavioral:         Please refer to HPI    Medications: I have reviewed the patient's current medications.  Current Outpatient Medications  Medication Sig Dispense Refill   amphetamine -dextroamphetamine  (ADDERALL) 20 MG tablet Take 1 tablet (20 mg total) by mouth 2 (two) times daily. 60 tablet 0   busPIRone  (BUSPAR ) 10 MG tablet Take 1 tablet (10 mg total) by mouth 2 (two) times daily. 60 tablet 2   clindamycin -benzoyl peroxide (BENZACLIN) gel Apply topically 2 (two) times daily. 50 g 3   desvenlafaxine  (PRISTIQ ) 100 MG 24 hr tablet Take 1 tablet (100 mg total) by mouth daily. 30 tablet 2   ISIBLOOM  0.15-30 MG-MCG tablet TAKE 1 TABLET BY MOUTH EVERY DAY 84 tablet 0   risperiDONE  (RISPERDAL ) 0.25 MG tablet TAKE 1 TABLET BY MOUTH EVERYDAY AT BEDTIME 30 tablet 0   No current facility-administered medications for this visit.    Medication Side Effects: None  Allergies: Allergies[1]  Past Medical History:  Diagnosis Date   Acne vulgaris  ADHD (attention deficit hyperactivity disorder)    Diaphoresis    Fatigue    Generalized anxiety disorder    h/o panic attacks   Obsessive-compulsive disorder     Family History  Problem Relation Age of Onset   Anxiety disorder Mother    ADD / ADHD Mother    Depression Mother    Asthma Mother    OCD Father    Hypertension Father     Hypercholesterolemia Father    Anxiety disorder Father    Stroke Paternal Grandfather        possibly in his 42's   Alcohol abuse Neg Hx    Bipolar disorder Neg Hx    Dementia Neg Hx    Drug abuse Neg Hx    Paranoid behavior Neg Hx    Physical abuse Neg Hx    Schizophrenia Neg Hx    Seizures Neg Hx    Sexual abuse Neg Hx    Cancer Neg Hx    Diabetes Neg Hx    Heart disease Neg Hx     Social History   Socioeconomic History   Marital status: Single    Spouse name: Not on file   Number of children: Not on file   Years of education: 13   Highest education level: Bachelor's degree (e.g., BA, AB, BS)  Occupational History   Occupation: not employed   Occupation: Consulting Civil Engineer  Tobacco Use   Smoking status: Never   Smokeless tobacco: Never  Vaping Use   Vaping status: Some Days   Substances: Synthetic cannabinoids  Substance and Sexual Activity   Alcohol use: Not Currently    Alcohol/week: 5.0 - 6.0 standard drinks of alcohol    Types: 5 - 6 Glasses of wine per week    Comment: 1-2 glasses of wine 3 days/week   Drug use: Not Currently    Comment: Delta 8 vape --none since 12/2023   Sexual activity: Not Currently    Partners: Male    Birth control/protection: Pill    Comment: condoms (with new relationships)  Other Topics Concern   Not on file  Social History Narrative   Graduated from [employer] 01/2022. Majored in theatre manager.      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      Currently living either at home with parents, or at her apartment in [place].   She is currently searching for a job.   Older brother lives in [place]      Updated 03/2024   Social Drivers of Health   Tobacco Use: Low Risk (08/28/2024)   Patient History    Smoking Tobacco Use: Never    Smokeless Tobacco Use: Never    Passive Exposure: Not on file  Financial Resource Strain: Low Risk (04/14/2024)   Overall Financial  Resource Strain (CARDIA)    Difficulty of Paying Living Expenses: Not very hard  Food Insecurity: No Food Insecurity (04/14/2024)   Epic    Worried About Radiation Protection Practitioner of Food in the Last Year: Never true    Ran Out of Food in the Last Year: Never true  Transportation Needs: No Transportation Needs (04/14/2024)   Epic    Lack of Transportation (Medical): No    Lack of Transportation (Non-Medical): No  Physical Activity: Sufficiently Active (04/14/2024)   Exercise Vital Sign    Days of Exercise per Week: 4 days    Minutes of Exercise per Session: 50 min  Stress: No  Stress Concern Present (04/14/2024)   [employer] of Occupational Health - Occupational Stress Questionnaire    Feeling of Stress: Only a little  Social Connections: Moderately Isolated (04/14/2024)   Social Connection and Isolation Panel    Frequency of Communication with Friends and Family: Three times a week    Frequency of Social Gatherings with Friends and Family: Three times a week    Attends Religious Services: 1 to 4 times per year    Active Member of Clubs or Organizations: No    Attends Banker Meetings: Not on file    Marital Status: Never married  Intimate Partner Violence: Not At Risk (04/15/2024)   Epic    Fear of Current or Ex-Partner: No    Emotionally Abused: No    Physically Abused: No    Sexually Abused: No  Depression (PHQ2-9): Medium Risk (05/30/2024)   Depression (PHQ2-9)    PHQ-2 Score: 7  Alcohol Screen: Low Risk (04/14/2024)   Alcohol Screen    Last Alcohol Screening Score (AUDIT): 5  Housing: Low Risk (04/14/2024)   Epic    Unable to Pay for Housing in the Last Year: No    Number of Times Moved in the Last Year: 0    Homeless in the Last Year: No  Utilities: Not At Risk (04/15/2024)   Epic    Threatened with loss of utilities: No  Health Literacy: Not on file    Past Medical History, Surgical history, Social history, and Family history were reviewed and updated as appropriate.    Please see review of systems for further details on the patient's review from today.   Objective:   Physical Exam:  There were no vitals taken for this visit.  Physical Exam Constitutional:      General: She is not in acute distress. Musculoskeletal:        General: No deformity.  Neurological:     Mental Status: She is alert and oriented to person, place, and time.     Coordination: Coordination normal.  Psychiatric:        Attention and Perception: Attention and perception normal. She does not perceive auditory or visual hallucinations.        Mood and Affect: Mood normal. Mood is not anxious or depressed. Affect is not labile, blunt, angry or inappropriate.        Speech: Speech normal.        Behavior: Behavior normal.        Thought Content: Thought content normal. Thought content is not paranoid or delusional. Thought content does not include homicidal or suicidal ideation. Thought content does not include homicidal or suicidal plan.        Cognition and Memory: Cognition and memory normal.        Judgment: Judgment normal.     Comments: Insight intact     Lab Review:     Component Value Date/Time   NA 138 01/11/2024 1213   K 4.9 01/11/2024 1213   CL 103 01/11/2024 1213   CO2 21 01/11/2024 1213   GLUCOSE 92 01/11/2024 1213   BUN 9 01/11/2024 1213   CREATININE 0.83 01/11/2024 1213   CALCIUM 9.8 01/11/2024 1213   PROT 7.2 01/11/2024 1213   ALBUMIN 4.7 01/11/2024 1213   AST 16 01/11/2024 1213   ALT 21 01/11/2024 1213   ALKPHOS 76 01/11/2024 1213   BILITOT 0.3 01/11/2024 1213       Component Value Date/Time   WBC 7.5 01/11/2024 1213  RBC 4.40 01/11/2024 1213   HGB 13.1 01/11/2024 1213   HCT 40.6 01/11/2024 1213   PLT 329 01/11/2024 1213   MCV 92 01/11/2024 1213   MCH 29.8 01/11/2024 1213   MCHC 32.3 01/11/2024 1213   RDW 13.1 01/11/2024 1213   LYMPHSABS 2.0 01/11/2024 1213   EOSABS 0.0 01/11/2024 1213   BASOSABS 0.0 01/11/2024 1213    No results  found for: POCLITH, LITHIUM   No results found for: PHENYTOIN, PHENOBARB, VALPROATE, CBMZ   .res Assessment: Plan:    Plan:  Change: D/C Risperdal  0.25 at bedtime for mood symptoms - mood remains stable. Advised patient to call the office if mood decompensates.  Continue: Adderall 20mg  BID.  Pristiq  100mg  daily  Buspar  10mg  BID  PDMP reviewed  RTC 4 weeks   Therapist - Deane Sprang  15 minutes spent dedicated to the care of this patient on the date of this encounter to include pre-visit review of records, ordering of medication, post visit documentation, and face-to-face time with the patient discussing ADD, GAD, and OCD.  Discussed potential metabolic side effects associated with atypical antipsychotics, as well as potential risk for movement side effects. Advised pt to contact office if movement side effects occur.    Discussed potential benefits, risks, and side effects of stimulants with patient to include increased heart rate, palpitations, insomnia, increased anxiety, increased irritability, or decreased appetite. Instructed patient to contact office if experiencing any significant tolerability issues.   Patient advised to contact office with any questions, adverse effects, or acute worsening in signs and symptoms.  There are no diagnoses linked to this encounter.   Please see After Visit Summary for patient specific instructions.  Future Appointments  Date Time Provider Department Center  10/08/2024  1:30 PM Remo Kirschenmann Nattalie, NP CP-CP None  05/08/2025  8:45 AM Randol Dawes, MD P[facility] [phone] [place]    No orders of the defined types were placed in this encounter.     -------------------------------     [1] No Known Allergies

## 2024-11-13 ENCOUNTER — Telehealth: Admitting: Adult Health

## 2025-05-08 ENCOUNTER — Encounter: Payer: Self-pay | Admitting: Family Medicine
